# Patient Record
Sex: Male | Born: 1980 | Race: White | Hispanic: No | Marital: Married | Smoking: Current every day smoker
Health system: Southern US, Community
[De-identification: ages and names within clinical notes are randomized; demographics above are authoritative.]

## PROBLEM LIST (undated history)

## (undated) DIAGNOSIS — I10 Essential (primary) hypertension: Secondary | ICD-10-CM

## (undated) HISTORY — PX: MYRINGOTOMY: SUR874

## (undated) HISTORY — PX: ADENOIDECTOMY: SUR15

## (undated) HISTORY — PX: TONSILLECTOMY: SUR1361

---

## 2005-07-13 ENCOUNTER — Ambulatory Visit: Payer: Self-pay | Admitting: Family Medicine

## 2006-04-12 ENCOUNTER — Ambulatory Visit: Payer: Self-pay | Admitting: Family Medicine

## 2006-04-12 DIAGNOSIS — J309 Allergic rhinitis, unspecified: Secondary | ICD-10-CM | POA: Insufficient documentation

## 2006-04-12 DIAGNOSIS — R51 Headache: Secondary | ICD-10-CM | POA: Insufficient documentation

## 2006-04-12 DIAGNOSIS — R519 Headache, unspecified: Secondary | ICD-10-CM | POA: Insufficient documentation

## 2006-04-20 ENCOUNTER — Telehealth: Payer: Self-pay | Admitting: Family Medicine

## 2006-10-08 ENCOUNTER — Ambulatory Visit: Payer: Self-pay | Admitting: Family Medicine

## 2008-02-18 ENCOUNTER — Ambulatory Visit: Payer: Self-pay | Admitting: Family Medicine

## 2008-02-18 DIAGNOSIS — H9209 Otalgia, unspecified ear: Secondary | ICD-10-CM | POA: Insufficient documentation

## 2008-11-17 ENCOUNTER — Ambulatory Visit: Payer: Self-pay | Admitting: Family Medicine

## 2009-09-23 ENCOUNTER — Ambulatory Visit: Payer: Self-pay | Admitting: Family Medicine

## 2009-09-23 DIAGNOSIS — J019 Acute sinusitis, unspecified: Secondary | ICD-10-CM | POA: Insufficient documentation

## 2009-09-23 DIAGNOSIS — G4700 Insomnia, unspecified: Secondary | ICD-10-CM | POA: Insufficient documentation

## 2009-09-24 ENCOUNTER — Encounter: Payer: Self-pay | Admitting: Family Medicine

## 2010-06-07 NOTE — Letter (Signed)
Summary: Depression Questionnaire  Depression Questionnaire   Imported By: Lanelle Bal 09/30/2009 13:18:17  _____________________________________________________________________  External Attachment:    Type:   Image     Comment:   External Document

## 2010-06-07 NOTE — Assessment & Plan Note (Signed)
Summary: sinusitis/ sleep prob   Vital Signs:  Patient profile:   30 year old male Height:      74.25 inches Weight:      208 pounds BMI:     26.62 O2 Sat:      100 % on Room air Temp:     98.5 degrees F oral Pulse rate:   88 / minute BP sitting:   132 / 88  (left arm) Cuff size:   large  Vitals Entered By: Payton Spark CMA (Sep 23, 2009 9:43 AM)  O2 Flow:  Room air CC: ? Sinus infection. C/O head congestion and cough x 2 weeks.    Primary Care Provider:  Nani Gasser MD  CC:  ? Sinus infection. C/O head congestion and cough x 2 weeks. Marland Kitchen  History of Present Illness: 30 yo WM presents for 2 wks of head congestion.  He reports a hx of allergies.  He has been taking Allegra D during this spring which ususally helps but he feels like he is getting worse.  Does not use a nasal spray.  He feels worse at night with sinus pressure in the forehead and cheeks and is unable to blow anything out.  He has some postnasal drip.  No Sore throat.  Mild cough at night.  He has not had a fever or malaise.  He notes trouble sleeping lately which has him feeling tired and some poor focus at work due to fatigue.  This came from stress with in laws.  He and wife have had to drive to [place] for the w/e to help out.      Current Medications (verified): 1)  Allegra-D 24 Hour 180-240 Mg Tb24 (Fexofenadine-Pseudoephedrine) .... Take 1 Tablet By Mouth Once A Day  Allergies (verified): 1)  ! Penicillin G Potassium (Penicillin G Potassium)  Past History:  Past Medical History: Reviewed history from 02/13/2006 and no changes required. _  Past Surgical History: Reviewed history from 02/13/2006 and no changes required. T & A as child, tympanostomy tubes X 4  Social History: Reviewed history from 02/13/2006 and no changes required. 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  Single.  Quit smoking 12/06, no EtOH, no drugs, 3 caffeinated drinks daily, Works out for 1 hr  daily.  Review of Systems      See HPI  Physical Exam  General:  alert, well-developed, well-nourished, and well-hydrated.   Head:  normocephalic and atraumatic.  frontal sinus TTP Eyes:  conjunctiva mildly injected Ears:  scarring both TMs o/w normal Nose:  clear rhinorrhea/ boggy turbinates Mouth:  pharynx pink and moist and fair dentition.   Neck:  no masses.   Lungs:  Normal respiratory effort, chest expands symmetrically. Lungs are clear to auscultation, no crackles or wheezes. Heart:  Normal rate and regular rhythm. S1 and S2 normal without gallop, murmur, click, rub or other extra sounds. Skin:  color normal.   Cervical Nodes:  No lymphadenopathy noted Psych:  good eye contact, not anxious appearing, and not depressed appearing.     Impression & Recommendations:  Problem # 1:  ACUTE SINUSITIS, UNSPECIFIED (ICD-461.9) Treat with Zithromax x 5 days.  Use Allegra D for congestion.  Use Netti Pot 2 x a day and Advil for pain. His updated medication list for this problem includes:    Allegra-d 24 Hour 180-240 Mg Tb24 (Fexofenadine-pseudoephedrine) .Marland Kitchen... Take 1 tablet by mouth once a day    Nasonex 50 Mcg/act Susp (Mometasone furoate) .Marland Kitchen... 2 sprays/ nostril  daily    Zithromax Z-pak 250 Mg Tabs (Azithromycin) .Marland Kitchen... 2 tabs by mouth x 1 day then 1 tab by mouth daily x 4 days  Problem # 2:  ALLERGIC RHINITIS (ICD-477.9) Add Nasonex to his Allegra D for seasonal allergies. His updated medication list for this problem includes:    Nasonex 50 Mcg/act Susp (Mometasone furoate) .Marland Kitchen... 2 sprays/ nostril daily  Problem # 3:  INSOMNIA, TRANSIENT (ICD-780.52) PHQ-9 done to r/o depression.  Score of 7 but pt claims this to be from acute transient stressors and lack of sleep.  He agrees to try RX sleep aid and work on stress reduction.  Will call w/ any changes. His updated medication list for this problem includes:    Zolpidem Tartrate 10 Mg Tabs (Zolpidem tartrate) .Marland Kitchen... 1 tab by mouth at  bedtime as needed sleep  Complete Medication List: 1)  Allegra-d 24 Hour 180-240 Mg Tb24 (Fexofenadine-pseudoephedrine) .... Take 1 tablet by mouth once a day 2)  Nasonex 50 Mcg/act Susp (Mometasone furoate) .... 2 sprays/ nostril daily 3)  Zithromax Z-pak 250 Mg Tabs (Azithromycin) .... 2 tabs by mouth x 1 day then 1 tab by mouth daily x 4 days 4)  Zolpidem Tartrate 10 Mg Tabs (Zolpidem tartrate) .Marland Kitchen.. 1 tab by mouth at bedtime as needed sleep  Patient Instructions: 1)  Take Zithromax x 5 days for sinusitis. 2)  Add Nasonex daily to your allegra D for allergies. 3)  Use Zolpidem only as needed for sleep. Prescriptions: ZOLPIDEM TARTRATE 10 MG TABS (ZOLPIDEM TARTRATE) 1 tab by mouth at bedtime as needed sleep  #10 x 0   Entered and Authorized by:   Seymour Bars DO   Signed by:   Seymour Bars DO on 09/23/2009   Method used:   Printed then faxed to ...       [facility]  [place] [phone][facility] (retail)       [address] [place], [place]  [address]       Ph: [phone] or [phone]       Fax: [phone]   RxID:   [id number] ZITHROMAX Z-PAK 250 MG TABS (AZITHROMYCIN) 2 tabs by mouth x 1 day then 1 tab by mouth daily x 4 days  #1 pack x 0   Entered and Authorized by:   Seymour Bars DO   Signed by:   Seymour Bars DO on 09/23/2009   Method used:   Electronically to        [facility] [phone]* (retail)       [address] [place], [place]  [address]       Ph: [phone] or [phone]       Fax: [phone]   RxID:   [id number] NASONEX 50 MCG/ACT SUSP (MOMETASONE FUROATE) 2 sprays/ nostril daily  #1 bottle x 2   Entered and Authorized by:   Seymour Bars DO   Signed by:   Seymour Bars DO on 09/23/2009   Method used:   [facility]ronically to        [facility] [phone]* (retail)       [address] [place], [place]  [address]       Ph: [phone] or [phone]       Fax: [phone]   RxID:   [id number]

## 2011-03-20 ENCOUNTER — Emergency Department
Admission: EM | Admit: 2011-03-20 | Discharge: 2011-03-20 | Disposition: A | Discharge status: Home / Self Care | Payer: BC Managed Care – PPO | Source: Home / Self Care | Attending: Emergency Medicine | Admitting: Emergency Medicine

## 2011-03-20 ENCOUNTER — Encounter: Payer: Self-pay | Admitting: *Deleted

## 2011-03-20 DIAGNOSIS — J309 Allergic rhinitis, unspecified: Secondary | ICD-10-CM

## 2011-03-20 MED ORDER — AZITHROMYCIN 250 MG PO TABS: ORAL_TABLET | ORAL | Status: AC

## 2011-03-20 NOTE — ED Notes (Signed)
Pt c/o allergies, nasal congestion, yellow nasal d/c, and LT sided sinus burning/preesure/pain x 1 wk. No fever.

## 2011-03-20 NOTE — ED Provider Notes (Signed)
History     CSN: [id number] Arrival date & time: 03/20/2011  9:17 AM   First MD Initiated Contact with Patient 03/20/11 [phone]      Chief Complaint  Patient presents with  . Nasal Congestion  . Allergies    (Consider location/radiation/quality/duration/timing/severity/associated sxs/prior treatment) HPI Benjamin White is a 30 y.o. male who complains of onset of cold symptoms for a few days.  They are using Allegra-D and Flonase which helps his allergies.  His wife is [redacted] weeks pregnant. No sore throat + cough No pleuritic pain No wheezing + nasal congestion + post-nasal drainage +L sided sinus pain/pressure No chest congestion No itchy/red eyes No earache No hemoptysis No SOB No chills/sweats No fever No nausea No vomiting No abdominal pain No diarrhea No skin rashes No fatigue No myalgias No headache    Past Medical History  Diagnosis Date  . Allergic rhinitis     Past Surgical History  Procedure Date  . Tonsillectomy   . Adenoidectomy   . Myringotomy     History reviewed. No pertinent family history.  History  Substance Use Topics  . Smoking status: Current Everyday Smoker -- 0.2 packs/day  . Smokeless tobacco: Not on file  . Alcohol Use: Yes     2 per wk      Review of Systems  Allergies  Penicillins  Home Medications   Current Outpatient Rx  Name Route Sig Dispense Refill  . FEXOFENADINE-PSEUDOEPHEDRINE 180-240 MG PO TB24 Oral Take 1 tablet by mouth daily.      Marland Kitchen FLUTICASONE PROPIONATE 50 MCG/ACT NA SUSP Nasal Place 2 sprays into the nose daily.        BP 119/86  Pulse 90  Temp(Src) 98.3 F (36.8 C) (Oral)  Resp 18  Ht 6\' 3"  (1.905 m)  Wt 209 lb 8 oz (95.029 kg)  BMI 26.19 kg/m2  SpO2 99%  Physical Exam  Nursing note and vitals reviewed. Constitutional: He is oriented to person, place, and time. He appears well-developed and well-nourished.  HENT:  Head: Normocephalic and atraumatic.  Right Ear: Tympanic membrane, external ear  and ear canal normal.  Left Ear: Tympanic membrane, external ear and ear canal normal.  Nose: Mucosal edema and rhinorrhea present. Left sinus exhibits maxillary sinus tenderness.  Mouth/Throat: Posterior oropharyngeal erythema present. No oropharyngeal exudate or posterior oropharyngeal edema.  Neck: Neck supple.  Cardiovascular: Regular rhythm and normal heart sounds.   Pulmonary/Chest: Effort normal and breath sounds normal. No respiratory distress.  Neurological: He is alert and oriented to person, place, and time.  Skin: Skin is warm and dry.  Psychiatric: He has a normal mood and affect. His speech is normal.    ED Course  Procedures (including critical care time)  Labs Reviewed - No data to display No results found.   No diagnosis found.    MDM  Allergic rhinitis vs sinusitis  1)  Take the prescribed antibiotic as instructed.  Hold for a few days, then if worsening can take the Zpak. 2)  Use nasal saline solution (over the counter) at least 3 times a day. 3)  Use over the counter decongestants like Zyrtec-D every 12 hours as needed to help with congestion.  If you have hypertension, do not take medicines with sudafed.  4)  Can take tylenol every 6 hours or motrin every 8 hours for pain or fever. 5)  Follow up with your primary doctor if no improvement in 5-7 days, sooner if increasing pain, fever, or new symptoms.  Must stop smoking ASAP, especially before baby is born.   Lily Kocher, MD 03/20/11 [phone]

## 2012-07-25 ENCOUNTER — Encounter: Payer: Self-pay | Admitting: Sports Medicine

## 2012-07-25 ENCOUNTER — Ambulatory Visit (INDEPENDENT_AMBULATORY_CARE_PROVIDER_SITE_OTHER): Payer: BC Managed Care – PPO | Admitting: Sports Medicine

## 2012-07-25 VITALS — BP 126/84 | HR 96 | Wt 200.0 lb

## 2012-07-25 DIAGNOSIS — R200 Anesthesia of skin: Secondary | ICD-10-CM | POA: Insufficient documentation

## 2012-07-25 DIAGNOSIS — J0100 Acute maxillary sinusitis, unspecified: Secondary | ICD-10-CM | POA: Insufficient documentation

## 2012-07-25 DIAGNOSIS — Z299 Encounter for prophylactic measures, unspecified: Secondary | ICD-10-CM

## 2012-07-25 DIAGNOSIS — Z0000 Encounter for general adult medical examination without abnormal findings: Secondary | ICD-10-CM | POA: Insufficient documentation

## 2012-07-25 DIAGNOSIS — Z87891 Personal history of nicotine dependence: Secondary | ICD-10-CM

## 2012-07-25 DIAGNOSIS — F17210 Nicotine dependence, cigarettes, uncomplicated: Secondary | ICD-10-CM | POA: Insufficient documentation

## 2012-07-25 DIAGNOSIS — R209 Unspecified disturbances of skin sensation: Secondary | ICD-10-CM

## 2012-07-25 MED ORDER — AZITHROMYCIN 250 MG PO TABS: ORAL_TABLET | ORAL | Status: DC

## 2012-07-25 MED ORDER — FLUTICASONE PROPIONATE 50 MCG/ACT NA SUSP: NASAL | Status: DC

## 2012-07-25 NOTE — Assessment & Plan Note (Signed)
Checking some routine blood work. 

## 2012-07-25 NOTE — Assessment & Plan Note (Signed)
Allergic to penicillin, azithromycin, Flonase. I hope that this will resolve his lip numbness.

## 2012-07-25 NOTE — Assessment & Plan Note (Signed)
Symptoms are not classic for TIA. Exam is benign and I cannot pick up the numbness. We are going to keep this in the back of our hands and treated for a sinus infection. He will return in a week to reevaluate this, if still no better we can consider TIA workup.

## 2012-07-25 NOTE — Assessment & Plan Note (Signed)
Currently quit. Congratulated.

## 2012-07-25 NOTE — Progress Notes (Signed)
Subjective:    CC: Establish care.   HPI:  Lip numbness: This pleasant 32 year old male is having a great deal of stress in his life comes in with 4 days of numbness in his left upper lip only on the left half. He denies any subsequent headaches, palpitations, chest pain, numbness or tingling in any other part of his body, or weakness. He does have some sniffles, nasal drainage and left-sided maxillary sinus pressure. He is a smoker but recently quit, and is worried about heart attack, stroke, and oral cancer. Symptoms are intermittent.  Improving.  Smoker: Quit recently.  Preventive measure: Has not had any blood work done.  Past medical history, Surgical history, Family history not pertinant except as noted below, Social history, Allergies, and medications have been entered into the medical record, reviewed, and no changes needed.   Review of Systems: No headache, visual changes, nausea, vomiting, diarrhea, constipation, dizziness, abdominal pain, skin rash, fevers, chills, night sweats, swollen lymph nodes, weight loss, chest pain, body aches, joint swelling, muscle aches, shortness of breath, mood changes, visual or auditory hallucinations.  Objective:    General: Well Developed, well nourished, and in no acute distress.  Neuro: Alert and oriented x3, extra-ocular muscles intact, sensation grossly intact. Cranial nerves II through XII intact, motor, sensory, coordinative functions are intact. HEENT: Normocephalic, atraumatic, pupils equal round reactive to light, neck supple, no masses, no lymphadenopathy, thyroid nonpalpable. Entire oropharynx examined, no lesions seen, bilateral tympanic membrane sclerosis, mild tenderness over maxillary sinuses. Skin: Warm and dry, no rashes noted.  Cardiac: Regular rate and rhythm, no murmurs rubs or gallops.  Respiratory: Clear to auscultation bilaterally. Not using accessory muscles, speaking in full sentences.  Abdominal: Soft, nontender,  nondistended, positive bowel sounds, no masses, no organomegaly.  Musculoskeletal: Shoulder, elbow, wrist, hip, knee, ankle stable, and with full range of motion. Impression and Recommendations:   The patient was counselled, risk factors were discussed, anticipatory guidance given.

## 2012-07-30 ENCOUNTER — Ambulatory Visit: Payer: BC Managed Care – PPO | Admitting: Sports Medicine

## 2012-12-04 LAB — COMPREHENSIVE METABOLIC PANEL
Albumin: 5.1 g/dL (ref 3.5–5.2)
Alkaline Phosphatase: 65 U/L (ref 39–117)
BUN: 12 mg/dL (ref 6–23)
Calcium: 10.2 mg/dL (ref 8.4–10.5)
Chloride: 100 mEq/L (ref 96–112)
Creat: 1 mg/dL (ref 0.50–1.35)
Glucose, Bld: 87 mg/dL (ref 70–99)
Potassium: 4.9 mEq/L (ref 3.5–5.3)

## 2012-12-04 LAB — LIPID PANEL
Cholesterol: 171 mg/dL (ref 0–200)
HDL: 52 mg/dL (ref >39)
LDL Cholesterol: 96 mg/dL (ref 0–99)
Total CHOL/HDL Ratio: 3.3 Ratio
Triglycerides: 115 mg/dL (ref <150)
VLDL: 23 mg/dL (ref 0–40)

## 2012-12-04 LAB — COMPREHENSIVE METABOLIC PANEL WITH GFR
ALT: 15 U/L (ref 0–53)
AST: 15 U/L (ref 0–37)
CO2: 27 meq/L (ref 19–32)
Sodium: 138 meq/L (ref 135–145)
Total Bilirubin: 1.3 mg/dL — ABNORMAL HIGH (ref 0.3–1.2)
Total Protein: 7.6 g/dL (ref 6.0–8.3)

## 2012-12-04 LAB — CBC
HCT: 45.6 % (ref 39.0–52.0)
Hemoglobin: 16.2 g/dL (ref 13.0–17.0)
MCH: 32.2 pg (ref 26.0–34.0)
MCHC: 35.5 g/dL (ref 30.0–36.0)
MCV: 90.7 fL (ref 78.0–100.0)
Platelets: 332 K/uL (ref 150–400)
RBC: 5.03 MIL/uL (ref 4.22–5.81)
RDW: 13.8 % (ref 11.5–15.5)
WBC: 8.9 K/uL (ref 4.0–10.5)

## 2012-12-04 LAB — TSH: TSH: 1.381 u[IU]/mL (ref 0.350–4.500)

## 2012-12-05 LAB — VITAMIN D 25 HYDROXY (VIT D DEFICIENCY, FRACTURES): Vit D, 25-Hydroxy: 64 ng/mL (ref 30–89)

## 2012-12-06 ENCOUNTER — Ambulatory Visit (INDEPENDENT_AMBULATORY_CARE_PROVIDER_SITE_OTHER): Payer: BC Managed Care – PPO | Admitting: Sports Medicine

## 2012-12-06 ENCOUNTER — Encounter: Payer: Self-pay | Admitting: Sports Medicine

## 2012-12-06 VITALS — BP 136/84 | HR 74 | Wt 189.0 lb

## 2012-12-06 DIAGNOSIS — R209 Unspecified disturbances of skin sensation: Secondary | ICD-10-CM

## 2012-12-06 DIAGNOSIS — R200 Anesthesia of skin: Secondary | ICD-10-CM

## 2012-12-06 NOTE — Assessment & Plan Note (Addendum)
This is persistent and seems to be associated with emotional stressors. There is no preceding or subsequent headache, or any other symptoms to suggest multiple sclerosis, or complex migraines. No associated cardiovascular symptoms. He does describe numbness in the left trigeminal distribution. At this point I am going to obtain carotid Dopplers, as well as an MRI of the brain with special focus on the pons. I like to see him back after the MRI.  MRI did show a lesion in the left pons which could explain left lip numbness, at this point I am going to refer him to a neurologist.
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
## 2012-12-09 ENCOUNTER — Telehealth: Payer: Self-pay

## 2012-12-09 NOTE — Addendum Note (Signed)
Addended by: Monica Becton on: 12/09/2012 01:20 PM   Modules accepted: Orders

## 2012-12-09 NOTE — Telephone Encounter (Signed)
PA has been submitted to [facility] for MRI brain and MRI face. It has not been approved as of now. Papers are in the PA imaging folder. Vernetta Dizdarevic,CMA

## 2012-12-11 ENCOUNTER — Telehealth: Payer: Self-pay | Admitting: Sports Medicine

## 2012-12-11 ENCOUNTER — Other Ambulatory Visit: Payer: Self-pay | Admitting: Sports Medicine

## 2012-12-11 DIAGNOSIS — Z1389 Encounter for screening for other disorder: Secondary | ICD-10-CM

## 2012-12-11 NOTE — Telephone Encounter (Signed)
Discussed with physician reviewer, MRI of the brain and MRI face ordered, approval #[id number] good for 30 days, and then for both exams.

## 2012-12-14 ENCOUNTER — Ambulatory Visit (HOSPITAL_BASED_OUTPATIENT_CLINIC_OR_DEPARTMENT_OTHER)
Admission: RE | Admit: 2012-12-14 | Discharge: 2012-12-14 | Disposition: A | Discharge status: Home / Self Care | Payer: BC Managed Care – PPO | Source: Ambulatory Visit | Attending: Sports Medicine | Admitting: Sports Medicine

## 2012-12-14 DIAGNOSIS — Z1389 Encounter for screening for other disorder: Secondary | ICD-10-CM

## 2012-12-14 DIAGNOSIS — R209 Unspecified disturbances of skin sensation: Secondary | ICD-10-CM | POA: Insufficient documentation

## 2012-12-14 DIAGNOSIS — R200 Anesthesia of skin: Secondary | ICD-10-CM

## 2012-12-14 MED ORDER — GADOBENATE DIMEGLUMINE 529 MG/ML IV SOLN
20.0000 mL | Freq: Once | INTRAVENOUS | Status: AC | PRN
  Administered 2012-12-14: 20 mL via INTRAVENOUS

## 2012-12-16 NOTE — Addendum Note (Signed)
Addended by: Monica Becton on: 12/16/2012 09:12 AM   Modules accepted: Orders

## 2012-12-17 ENCOUNTER — Other Ambulatory Visit: Payer: BC Managed Care – PPO

## 2012-12-19 ENCOUNTER — Ambulatory Visit (HOSPITAL_BASED_OUTPATIENT_CLINIC_OR_DEPARTMENT_OTHER)
Admission: RE | Admit: 2012-12-19 | Discharge: 2012-12-19 | Disposition: A | Discharge status: Home / Self Care | Payer: BC Managed Care – PPO | Source: Ambulatory Visit | Attending: Sports Medicine | Admitting: Sports Medicine

## 2012-12-19 ENCOUNTER — Other Ambulatory Visit: Payer: Self-pay | Admitting: Sports Medicine

## 2012-12-19 DIAGNOSIS — R209 Unspecified disturbances of skin sensation: Secondary | ICD-10-CM | POA: Insufficient documentation

## 2012-12-19 DIAGNOSIS — R200 Anesthesia of skin: Secondary | ICD-10-CM

## 2012-12-20 ENCOUNTER — Ambulatory Visit (INDEPENDENT_AMBULATORY_CARE_PROVIDER_SITE_OTHER): Payer: BC Managed Care – PPO | Admitting: Neurology

## 2012-12-20 ENCOUNTER — Encounter: Payer: Self-pay | Admitting: Neurology

## 2012-12-20 VITALS — BP 135/85 | HR 87 | Ht 74.0 in | Wt 191.0 lb

## 2012-12-20 DIAGNOSIS — R209 Unspecified disturbances of skin sensation: Secondary | ICD-10-CM

## 2012-12-20 DIAGNOSIS — R200 Anesthesia of skin: Secondary | ICD-10-CM

## 2012-12-20 NOTE — Progress Notes (Signed)
Reason for visit: Numbness  Benjamin White is a 32 y.o. male  History of present illness:  Benjamin White is a 32 year old right-handed white male with a history of intermittent numbness and tingling sensations involving the left side of the lips, and the left lower face that began in March of 2014. The patient indicates that he had some problems for 2 or 3 days, and the symptoms resolved until mid July 2014. The patient began having intermittent episodes of tingling and numbness that would come and go. The patient indicates that when he thought about it, the numbness will come on, and when he was active doing other things, the numbness would disappear. The patient denies any overt headache, vision changes, speech changes, swallowing problems, or neck pain. The patient denies any numbness or weakness of the arms or legs. The patient denies any weakness of the extremities, difficulty with balance, or problems controlling the bowels or the bladder. The patient had MRI evaluation of the brain that shows evidence of a questionable lesion in the left pons, but this is seen only on one slice, on the flair sequence. The possibility of an artifact was entertained. The study was done with and without gadolinium enhancement. The patient is sent to this office for an evaluation.  Past Medical History  Diagnosis Date  . Allergic rhinitis     Past Surgical History  Procedure Laterality Date  . Tonsillectomy    . Adenoidectomy    . Myringotomy      Family History  Problem Relation Age of Onset  . Hypertension Father     Social history:  reports that he has been smoking.  He quit smokeless tobacco use about 19 months ago. He reports that  drinks alcohol. He reports that he does not use illicit drugs.  Medications:  No current outpatient prescriptions on file prior to visit.   No current facility-administered medications on file prior to visit.    Allergies:  Allergies  Allergen Reactions  .  Penicillins Hives and Rash    ROS:  Out of a complete 14 system review of symptoms, the patient complains only of the following symptoms, and all other reviewed systems are negative.  Numbness Anxiety, and difficulty with sleep, racing thoughts Snoring  Blood pressure 135/85, pulse 87, height 6\' 2"  (1.88 m), weight 191 lb (86.637 kg).  Physical Exam  General: The patient is alert and cooperative at the time of the examination.  Head: Pupils are equal, round, and reactive to light. Discs are flat bilaterally.  Neck: The neck is supple, no carotid bruits are noted.  Respiratory: The respiratory examination is clear.  Cardiovascular: The cardiovascular examination reveals a regular rate and rhythm, no obvious murmurs or rubs are noted.  Skin: Extremities are without significant edema.  Neurologic Exam  Mental status:  Cranial nerves: Facial symmetry is present. There is good sensation of the face to pinprick and soft touch bilaterally. The strength of the facial muscles and the muscles to head turning and shoulder shrug are normal bilaterally. Speech is well enunciated, no aphasia or dysarthria is noted. Extraocular movements are full. Visual fields are full.  Motor: The motor testing reveals 5 over 5 strength of all 4 extremities. Good symmetric motor tone is noted throughout.  Sensory: Sensory testing is intact to pinprick, soft touch, vibration sensation, and position sense on all 4 extremities. No evidence of extinction is noted.  Coordination: Cerebellar testing reveals good finger-nose-finger and heel-to-shin bilaterally.  Gait and station:  Gait is normal. Tandem gait is normal. Romberg is negative. No drift is seen.  Reflexes: Deep tendon reflexes are symmetric and normal bilaterally. Toes are downgoing on the right, equivocally upgoing toe on the left.   Assessment/Plan:  1. Subjective numbness, left face  The patient indicates that the episodes of numbness and  tingling come and go, and seemed to generate some degree of anxiety. The patient has had a relatively normal MRI study, and the lesions seen in the left pons may be an artifact. The patient will undergo some blood work today, and the patient will be set up for a repeat MRI the brain in 6 months. The patient will contact me if he believes that new symptoms are evolving.  Marlan Palau MD 12/22/2012 2:09 PM  [facility] [address] [place], [place] [address]  Phone [phone] Fax [phone]

## 2012-12-24 ENCOUNTER — Encounter: Payer: Self-pay | Admitting: *Deleted

## 2012-12-25 ENCOUNTER — Other Ambulatory Visit: Payer: Self-pay | Admitting: Neurology

## 2012-12-27 ENCOUNTER — Telehealth: Payer: Self-pay | Admitting: Neurology

## 2012-12-30 ENCOUNTER — Telehealth: Payer: Self-pay | Admitting: Neurology

## 2012-12-30 NOTE — Telephone Encounter (Signed)
I called patient. The blood work that was done was unremarkable. We will recheck the MRI the brain in about 6 months.

## 2012-12-31 NOTE — Telephone Encounter (Signed)
Lab results faxed over from [facility] and given to doctor.

## 2013-01-20 ENCOUNTER — Encounter: Payer: Self-pay | Admitting: Sports Medicine

## 2013-01-21 MED ORDER — AMITRIPTYLINE HCL 50 MG PO TABS: ORAL_TABLET | ORAL | Status: DC

## 2013-02-26 LAB — ANA W/REFLEX: Anti Nuclear Antibody(ANA): NEGATIVE

## 2013-02-26 LAB — VITAMIN B12: Vitamin B-12: 438 pg/mL (ref 211–946)

## 2013-02-26 LAB — RHEUMATOID FACTOR: Rhuematoid fact SerPl-aCnc: 9.1 IU/mL (ref 0.0–13.9)

## 2013-02-27 ENCOUNTER — Telehealth: Payer: Self-pay | Admitting: Neurology

## 2013-02-27 NOTE — Telephone Encounter (Signed)
I called patient, left a message. I will call back later. 

## 2013-02-27 NOTE — Telephone Encounter (Signed)
Spoke to patient and he stated he is still having numbness on the left side of his face it happens about once a week.  He went to his referring doctor who prescribed an anti anxiety medication but he has not taken it.  [phone]

## 2013-02-27 NOTE — Progress Notes (Signed)
Quick Note:  Left message that the blood work results were unremarkable, per Dr. Anne Hahn. ______

## 2013-03-01 NOTE — Telephone Encounter (Signed)
I called the patient again, I left a message again. If the patient needs to talk with me, he is to call back, leaving number that he can be reached at.

## 2013-03-13 ENCOUNTER — Other Ambulatory Visit: Payer: Self-pay

## 2013-05-06 ENCOUNTER — Encounter: Payer: Self-pay | Admitting: Family Medicine

## 2013-05-06 ENCOUNTER — Ambulatory Visit (INDEPENDENT_AMBULATORY_CARE_PROVIDER_SITE_OTHER): Payer: BC Managed Care – PPO | Admitting: Family Medicine

## 2013-05-06 DIAGNOSIS — J309 Allergic rhinitis, unspecified: Secondary | ICD-10-CM

## 2013-05-06 DIAGNOSIS — H6690 Otitis media, unspecified, unspecified ear: Secondary | ICD-10-CM

## 2013-05-06 MED ORDER — CEFDINIR 300 MG PO CAPS: 300.0000 mg | ORAL_CAPSULE | Freq: Two times a day (BID) | ORAL | Status: AC

## 2013-05-06 MED ORDER — FLUTICASONE PROPIONATE 50 MCG/ACT NA SUSP: NASAL | Status: DC

## 2013-05-06 NOTE — Progress Notes (Signed)
CC: Benjamin White is a 32 y.o. male is here for right ear pain   Subjective: HPI:  Complains of bilateral ear pain described as fullness that is nonradiating right greater than left that has been present for the last 3 days worsening on a daily basis. Present all hours of the day nothing particularly makes better or worse. Accompanied by mild nasal congestion with clear nasal discharge. Reports decreased hearing in right ear. Denies recent trauma head injury. Denies fevers, chills, dizziness, motor sensory disturbances other than described above   Review Of Systems Outlined In HPI  Past Medical History  Diagnosis Date  . Allergic rhinitis      Family History  Problem Relation Age of Onset  . Hypertension Father      History  Substance Use Topics  . Smoking status: Current Every Day Smoker -- 0.25 packs/day  . Smokeless tobacco: Former Neurosurgeon    Quit date: 05/09/2011  . Alcohol Use: Yes     Comment: up to 4 drinks daily     Objective: Filed Vitals:   05/06/13 0934  BP: 135/79  Pulse: 84  Temp: 97.8 F (36.6 C)    General: Alert and Oriented, No Acute Distress HEENT: Pupils equal, round, reactive to light. Conjunctivae clear.  External ears unremarkable, canals clear with intact TMs bilateral middle ear purulence effusion with loss of landmarks on both tympanic membranes.. Pink inferior turbinates.  Moist mucous membranes, pharynx without inflammation nor lesions.  Neck supple without palpable lymphadenopathy nor abnormal masses. Neuro: Rinne and Weber normal Extremities: No peripheral edema.  Strong peripheral pulses.  Mental Status: No depression, anxiety, nor agitation. Skin: Warm and dry.  Assessment & Plan: Benjamin White was seen today for right ear pain.  Diagnoses and associated orders for this visit:  Otitis media, bilateral - cefdinir (OMNICEF) 300 MG capsule; Take 1 capsule (300 mg total) by mouth 2 (two) times daily. - fluticasone (FLONASE) 50 MCG/ACT  nasal spray; One spray each nostril twice a day.  Allergic sinusitis    Bilateral otitis media: Start Omnicef due to penicillin allergy reported as rash no history of life threatening reaction. Patient requests refill for fluticasone for seasonal allergies.  Return if symptoms worsen or fail to improve.

## 2013-05-20 ENCOUNTER — Encounter: Payer: Self-pay | Admitting: Family Medicine

## 2013-06-24 ENCOUNTER — Telehealth: Payer: Self-pay | Admitting: Neurology

## 2013-06-24 DIAGNOSIS — R9089 Other abnormal findings on diagnostic imaging of central nervous system: Secondary | ICD-10-CM

## 2013-06-24 DIAGNOSIS — R209 Unspecified disturbances of skin sensation: Secondary | ICD-10-CM

## 2013-06-24 NOTE — Telephone Encounter (Signed)
Message copied by Stephanie AcreWILLIS, CHARLES on Tue Jun 24, 2013 10:15 AM ------      Message from: Stephanie AcreWILLIS, CHARLES      Created: Fri Dec 20, 2012  2:00 PM       Repeat MRI the brain with and without gadolinium. Questionable left pontine lesion. ------

## 2013-06-24 NOTE — Telephone Encounter (Signed)
I called patient. The patient has had some left facial numbness off and on over the last 6 months. At times, he may feel normal. We will repeat the MRI the brain to look for any changes in the possible pontine lesion seen previously.

## 2013-06-27 ENCOUNTER — Other Ambulatory Visit: Payer: Self-pay | Admitting: Neurology

## 2013-06-27 DIAGNOSIS — R209 Unspecified disturbances of skin sensation: Secondary | ICD-10-CM

## 2013-07-01 ENCOUNTER — Ambulatory Visit (INDEPENDENT_AMBULATORY_CARE_PROVIDER_SITE_OTHER): Payer: BC Managed Care – PPO

## 2013-07-01 DIAGNOSIS — R209 Unspecified disturbances of skin sensation: Secondary | ICD-10-CM

## 2013-07-01 DIAGNOSIS — Z7729 Contact with and (suspected ) exposure to other hazardous substances: Secondary | ICD-10-CM

## 2013-07-02 ENCOUNTER — Other Ambulatory Visit: Payer: BC Managed Care – PPO

## 2013-07-03 ENCOUNTER — Other Ambulatory Visit: Payer: BC Managed Care – PPO

## 2013-07-16 ENCOUNTER — Ambulatory Visit (INDEPENDENT_AMBULATORY_CARE_PROVIDER_SITE_OTHER): Payer: BC Managed Care – PPO

## 2013-07-16 DIAGNOSIS — R209 Unspecified disturbances of skin sensation: Secondary | ICD-10-CM

## 2013-07-16 DIAGNOSIS — R930 Abnormal findings on diagnostic imaging of skull and head, not elsewhere classified: Secondary | ICD-10-CM

## 2013-07-16 DIAGNOSIS — R9089 Other abnormal findings on diagnostic imaging of central nervous system: Secondary | ICD-10-CM

## 2013-07-17 ENCOUNTER — Telehealth: Payer: Self-pay | Admitting: Neurology

## 2013-07-17 MED ORDER — GADOPENTETATE DIMEGLUMINE 469.01 MG/ML IV SOLN: 19.0000 mL | Freq: Once | INTRAVENOUS | Status: AC | PRN

## 2013-07-17 NOTE — Telephone Encounter (Signed)
I called patient. The MRI the brain was relatively unremarkable. I think explain the left facial numbness. The patient no longer has an identifiable lesion in the pons as was seen previously. This may have been an artifact.   MRI brain 07/16/2013:  Impression   Normal MRI brain (with and without). Incidentally noted is non-specific  fluid/inflammation in the mastoid air cells.

## 2013-07-31 ENCOUNTER — Ambulatory Visit (INDEPENDENT_AMBULATORY_CARE_PROVIDER_SITE_OTHER): Payer: BC Managed Care – PPO | Admitting: Sports Medicine

## 2013-07-31 ENCOUNTER — Encounter: Payer: Self-pay | Admitting: Sports Medicine

## 2013-07-31 VITALS — BP 138/87 | HR 79 | Temp 98.4°F | Ht 74.0 in | Wt 199.0 lb

## 2013-07-31 DIAGNOSIS — R209 Unspecified disturbances of skin sensation: Secondary | ICD-10-CM

## 2013-07-31 DIAGNOSIS — R200 Anesthesia of skin: Secondary | ICD-10-CM

## 2013-07-31 DIAGNOSIS — J0100 Acute maxillary sinusitis, unspecified: Secondary | ICD-10-CM

## 2013-07-31 DIAGNOSIS — H6990 Unspecified Eustachian tube disorder, unspecified ear: Secondary | ICD-10-CM | POA: Insufficient documentation

## 2013-07-31 DIAGNOSIS — Z23 Encounter for immunization: Secondary | ICD-10-CM

## 2013-07-31 DIAGNOSIS — H6980 Other specified disorders of Eustachian tube, unspecified ear: Secondary | ICD-10-CM | POA: Insufficient documentation

## 2013-07-31 MED ORDER — BECLOMETHASONE DIPROP MONOHYD 42 MCG/SPRAY NA SUSP: 2.0000 | Freq: Two times a day (BID) | NASAL | Status: DC

## 2013-07-31 MED ORDER — CLARITHROMYCIN 500 MG PO TABS: 500.0000 mg | ORAL_TABLET | Freq: Two times a day (BID) | ORAL | Status: DC

## 2013-07-31 NOTE — Progress Notes (Signed)
  Subjective:    CC: Sinus infection  HPI: For the past several days Benjamin White has had a burning sensation over his left maxillary sinus radiating to the upper teeth and his left ear with nasal discharge. No cough, no constitutional symptoms, no GI symptoms, moderate, persistent.  Lip numbness: Left-sided and persistent, it is associated with emotional stressors. His initial MRI of the brain did show a possible lesion which may have represented an artifact in the left side of the pons, which could explain his symptoms. I send him to neurology, neurology repeated an MRI which was negative for this previous lesion. He had no other symptoms to suggest multiple sclerosis, and no preceding or following headache to suggest complex migraines. We did prescribe amitriptyline at a previous visit which she has not yet started taking.  Past medical history, Surgical history, Family history not pertinant except as noted below, Social history, Allergies, and medications have been entered into the medical record, reviewed, and no changes needed.   Review of Systems: No fevers, chills, night sweats, weight loss, chest pain, or shortness of breath.   Objective:    General: Well Developed, well nourished, and in no acute distress.  Neuro: Alert and oriented x3, extra-ocular muscles intact, sensation grossly intact.  HEENT: Normocephalic, atraumatic, pupils equal round reactive to light, neck supple, no masses, no lymphadenopathy, thyroid nonpalpable. Oropharynx, nasopharynx, external ear canals are unremarkable with the exception of some sclerosis over both eardrums. Tender to palpation over the maxillary sinus on the left side. Skin: Warm and dry, no rashes. Cardiac: Regular rate and rhythm, no murmurs rubs or gallops, no lower extremity edema.  Respiratory: Clear to auscultation bilaterally. Not using accessory muscles, speaking in full sentences.  Impression and Recommendations:

## 2013-07-31 NOTE — Assessment & Plan Note (Signed)
Benjamin White continues to have occasional lip numbness, predominantly on the left side, his initial MRI did show a possible hypodensity in the pons that could explain his symptoms. His neurologist repeated the MRI, and this lesion had resolved, he does tell me he was having symptoms during the MRI. He also tells me of the symptoms occur when he is stressed out. I did add amitriptyline which he has not yet started taking.

## 2013-07-31 NOTE — Assessment & Plan Note (Signed)
Biaxin, Beconase. Return as needed.

## 2013-08-05 ENCOUNTER — Other Ambulatory Visit: Payer: Self-pay | Admitting: Family Medicine

## 2013-09-15 ENCOUNTER — Encounter: Payer: Self-pay | Admitting: Sports Medicine

## 2013-09-15 ENCOUNTER — Ambulatory Visit (INDEPENDENT_AMBULATORY_CARE_PROVIDER_SITE_OTHER): Payer: BC Managed Care – PPO | Admitting: Sports Medicine

## 2013-09-15 VITALS — BP 147/88 | HR 108 | Ht 74.0 in | Wt 202.0 lb

## 2013-09-15 DIAGNOSIS — R200 Anesthesia of skin: Secondary | ICD-10-CM

## 2013-09-15 DIAGNOSIS — H6980 Other specified disorders of Eustachian tube, unspecified ear: Secondary | ICD-10-CM

## 2013-09-15 DIAGNOSIS — R209 Unspecified disturbances of skin sensation: Secondary | ICD-10-CM

## 2013-09-15 DIAGNOSIS — H6990 Unspecified Eustachian tube disorder, unspecified ear: Secondary | ICD-10-CM

## 2013-09-15 MED ORDER — PHENYLEPHRINE HCL 10 MG PO TABS: 10.0000 mg | ORAL_TABLET | ORAL | Status: DC | PRN

## 2013-09-15 NOTE — Assessment & Plan Note (Signed)
Essentially resolved with amitriptyline.

## 2013-09-15 NOTE — Progress Notes (Signed)
  Subjective:    CC: Followup  HPI: Left lip numbness: Initially we did note a small hypodensity in the left pons which could explain left facial numbness, neurology repeated the MRI which showed resolution of his hypodensity. Subsequently I started amitriptyline, and now his numbness is essentially gone. He does me tell it did occur during times of stress.  Ear fullness: Saw him a couple of months ago, we treated him with a nasal steroid and antibiotics, symptoms resolved. He continues to use nasal steroids but unfortunately has persistent sensation of fullness behind the right eardrum. He has seen ENT in the distant past who recommended steroids versus myringotomy tube placement.  Past medical history, Surgical history, Family history not pertinant except as noted below, Social history, Allergies, and medications have been entered into the medical record, reviewed, and no changes needed.   Review of Systems: No fevers, chills, night sweats, weight loss, chest pain, or shortness of breath.   Objective:    General: Well Developed, well nourished, and in no acute distress.  Neuro: Alert and oriented x3, extra-ocular muscles intact, sensation grossly intact.  HEENT: Normocephalic, atraumatic, pupils equal round reactive to light, neck supple, no masses, no lymphadenopathy, thyroid nonpalpable. Oropharynx and nasopharynx are unremarkable, there is tympanosclerosis bilaterally with some mild serous otitis media bilaterally. Skin: Warm and dry, no rashes. Cardiac: Regular rate and rhythm, no murmurs rubs or gallops, no lower extremity edema.  Respiratory: Clear to auscultation bilaterally. Not using accessory muscles, speaking in full sentences.  Impression and Recommendations:

## 2013-09-15 NOTE — Patient Instructions (Signed)
Serous Otitis Media  Serous otitis media is fluid in the middle ear space. This space contains the bones for hearing and air. Air in the middle ear space helps to transmit sound.  The air gets there through the eustachian tube. This tube goes from the back of the nose (nasopharynx) to the middle ear space. It keeps the pressure in the middle ear the same as the outside world. It also helps to drain fluid from the middle ear space. CAUSES  Serous otitis media occurs when the eustachian tube gets blocked. Blockage can come from:  Ear infections.  Colds and other upper respiratory infections.  Allergies.  Irritants such as cigarette smoke.  Sudden changes in air pressure (such as descending in an airplane).  Enlarged adenoids.  A mass in the nasopharynx. During colds and upper respiratory infections, the middle ear space can become temporarily filled with fluid. This can happen after an ear infection also. Once the infection clears, the fluid will generally drain out of the ear through the eustachian tube. If it does not, then serous otitis media occurs. SIGNS AND SYMPTOMS   Hearing loss.  A feeling of fullness in the ear, without pain.  Young children may not show any symptoms but may show slight behavioral changes, such as agitation, ear pulling, or crying. DIAGNOSIS  Serous otitis media is diagnosed by an ear exam. Tests may be done to check on the movement of the eardrum. Hearing exams may also be done. TREATMENT  The fluid most often goes away without treatment. If allergy is the cause, allergy treatment may be helpful. Fluid that persists for several months may require minor surgery. A small tube is placed in the eardrum to:  Drain the fluid.  Restore the air in the middle ear space. In certain situations, antibiotics are used to avoid surgery. Surgery may be done to remove enlarged adenoids (if this is the cause). HOME CARE INSTRUCTIONS   Keep children away from tobacco  smoke.  Be sure to keep any follow-up appointments. SEEK MEDICAL CARE IF:   Your hearing is not better in 3 months.  Your hearing is worse.  You have ear pain.  You have drainage from the ear.  You have dizziness.  You have serous otitis media only in one ear or have any bleeding from your nose (epistaxis).  You notice a lump on your neck. MAKE SURE YOU:  Understand these instructions.   Will watch your condition.   Will get help right away if you are not doing well or get worse.  Document Released: 07/15/2003 Document Revised: 12/25/2012 Document Reviewed: 11/19/2012 Exit[employer] Patient Information 2014 [employer].  

## 2013-09-15 NOTE — Assessment & Plan Note (Signed)
Continue nasal steroids, adding decongestants. Return to see me in a month, referral to ENT for consideration of myringotomy tube placement if no better.

## 2013-09-22 ENCOUNTER — Telehealth: Payer: Self-pay

## 2013-09-22 MED ORDER — AZITHROMYCIN 250 MG PO TABS: ORAL_TABLET | ORAL | Status: DC

## 2013-09-22 NOTE — Telephone Encounter (Signed)
Patient advised.

## 2013-09-22 NOTE — Telephone Encounter (Signed)
Sounds like a sinus infection, I am going to call him in some azithromycin and he should continue Flonase.

## 2013-09-22 NOTE — Telephone Encounter (Signed)
Benjamin White is unable to come in to [place] for a few days. He has a sore throat, runny nose with yellow drainage and pressure in his face for a few days. Denies fever, chills or sweats. He has tried Sudafed and a [employer] Pot with some relief. Should he go see a doctor in [place] or give it a couple of days.

## 2015-04-25 IMAGING — CR DG ORBITS COMPLETE 4+V
2 series · 2 of 2 positions shown · non-contrast
Comparison: 12/14/2012.

CLINICAL DATA: 32-year-old male with history of metal exposure to
the orbits. Planned MRI. Initial encounter.

EXAM:
ORBITS FOR FOREIGN BODY - 2 VIEW

[view not recorded (1 of 2)]
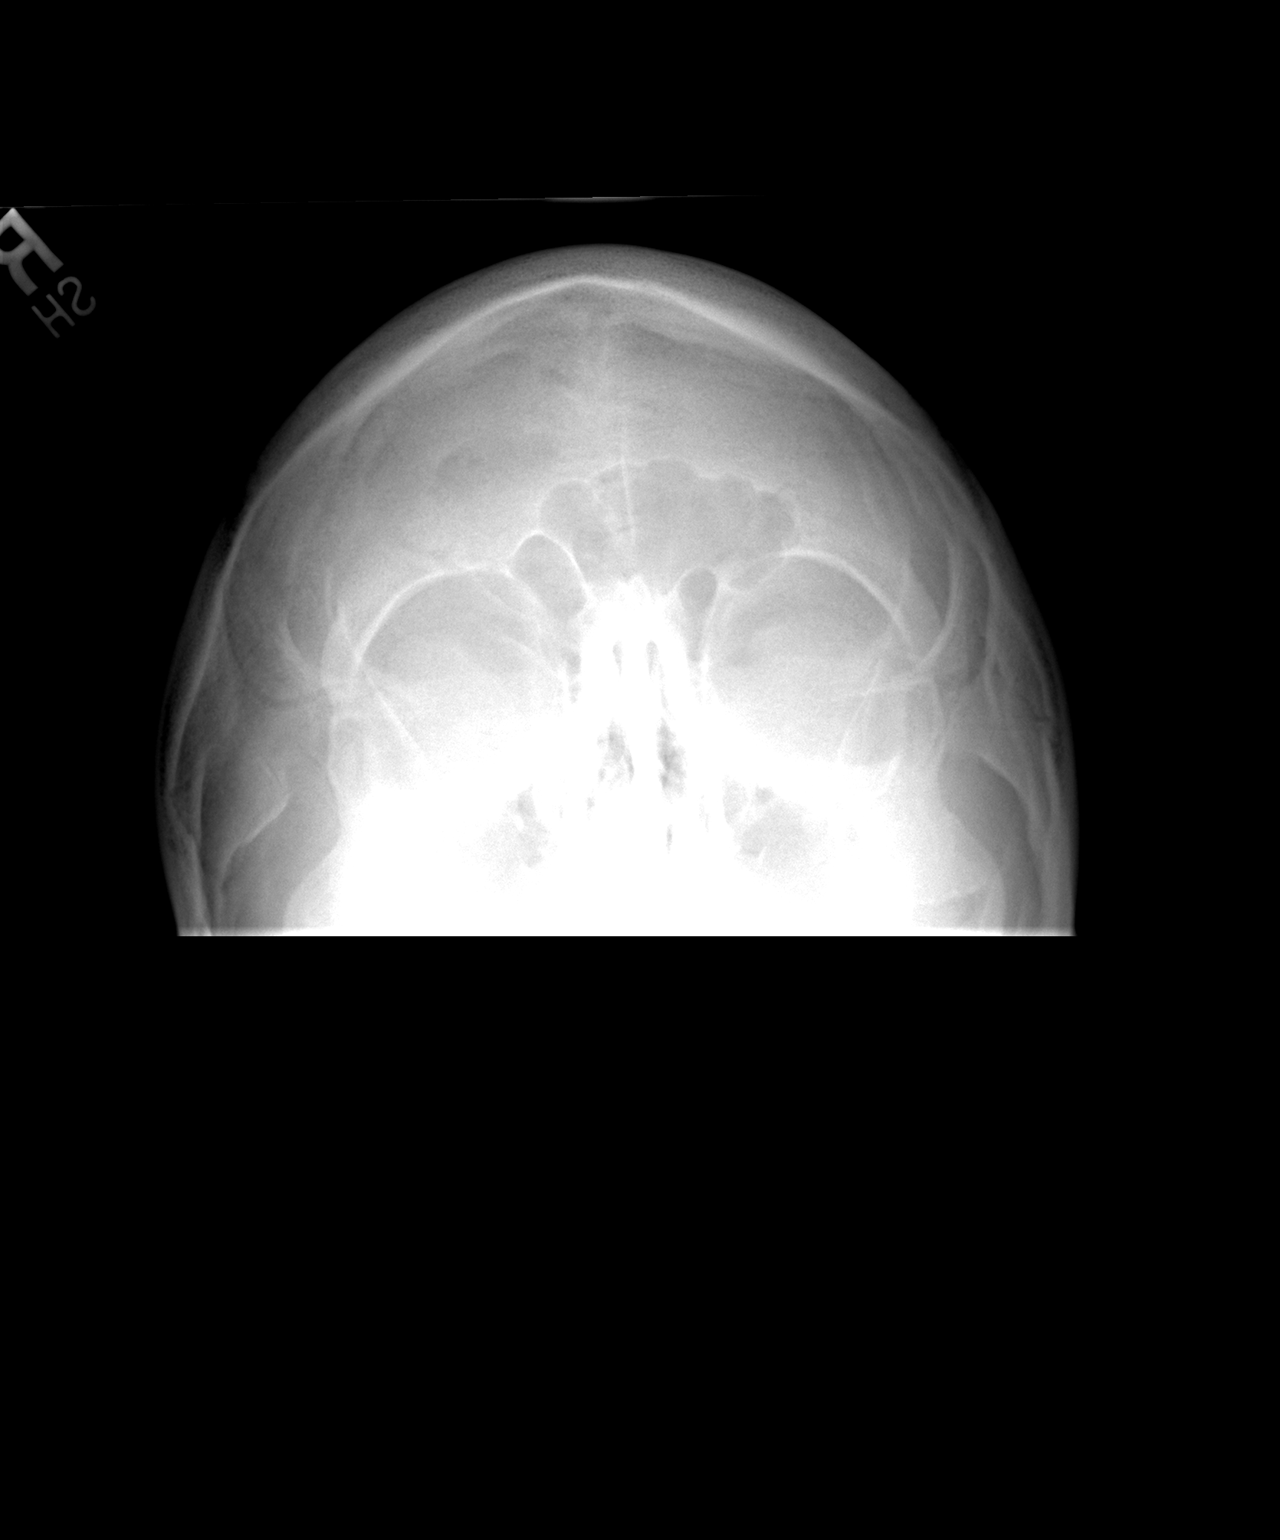

[view not recorded (2 of 2)]
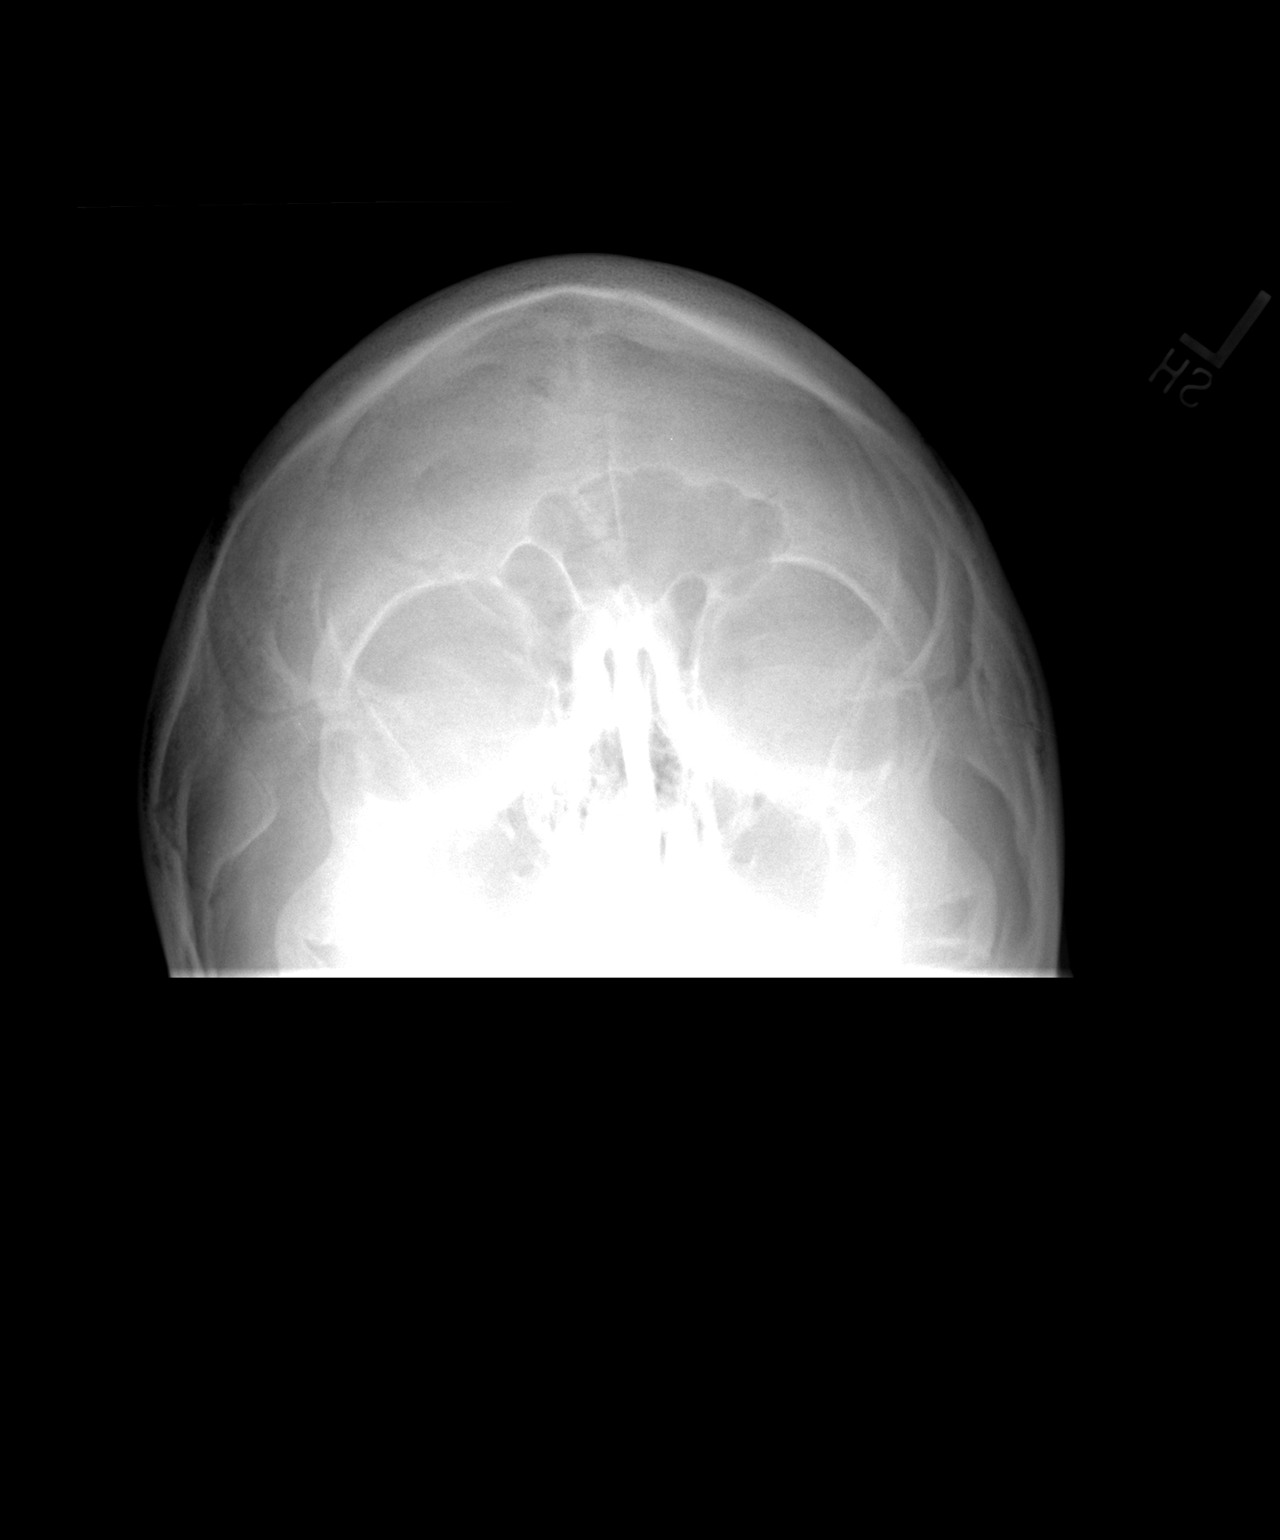

[2 of 2 positions shown; findings below may reference images not displayed]

FINDINGS: There is no evidence of metallic foreign body within the orbits. No
significant bone abnormality identified.
IMPRESSION: No evidence of metallic foreign body within the orbits.

## 2015-07-14 ENCOUNTER — Ambulatory Visit (INDEPENDENT_AMBULATORY_CARE_PROVIDER_SITE_OTHER): Payer: BLUE CROSS/BLUE SHIELD | Admitting: Osteopathic Medicine

## 2015-07-14 ENCOUNTER — Encounter: Payer: Self-pay | Admitting: Osteopathic Medicine

## 2015-07-14 VITALS — BP 137/86 | HR 102 | Temp 99.5°F | Wt 193.0 lb

## 2015-07-14 DIAGNOSIS — J069 Acute upper respiratory infection, unspecified: Secondary | ICD-10-CM

## 2015-07-14 DIAGNOSIS — B9789 Other viral agents as the cause of diseases classified elsewhere: Principal | ICD-10-CM

## 2015-07-14 MED ORDER — IPRATROPIUM BROMIDE 0.03 % NA SOLN: 2.0000 | Freq: Two times a day (BID) | NASAL | Status: DC

## 2015-07-14 MED ORDER — HYDROCODONE-HOMATROPINE 5-1.5 MG/5ML PO SYRP: 5.0000 mL | ORAL_SOLUTION | Freq: Four times a day (QID) | ORAL | Status: DC | PRN

## 2015-07-14 MED ORDER — METHYLPREDNISOLONE 4 MG PO TBPK: ORAL_TABLET | ORAL | Status: DC

## 2015-07-14 MED ORDER — AZITHROMYCIN 250 MG PO TABS: ORAL_TABLET | ORAL | Status: DC

## 2015-07-14 NOTE — Patient Instructions (Signed)
Fill prescription for azithromycin antibiotics if symptoms not better 7-10 days after illness started, or if you get significantly worse in the meantime. Over the counter medications - can take Mucinex-D or Nyquil in addition to your other prescription treatment. Let us know if you're feeling significantly worse - we may need to do a chest x-ray. Please note that post-viral cough can linger for a few weeks, but let us know if it persists longer than 3 - 4 weeks or becomes severe.

## 2015-07-14 NOTE — Progress Notes (Signed)
HPI: Benjamin White is a 35 y.o. male who presents to [facility] [place]  today for chief complaint of:  Chief Complaint  Patient presents with  . Cough    . Assoc signs/symptoms: see ROS, . Duration: cough started maybe 6 days ago . Modifying factors: has tried the following OTC/Rx medications: Mucinex and Sudafed, Nyquil  . Context:  (+) sick contacts at home, they were all better after about 3 days    Past medical, social and family history reviewed. Current medications and allergies reviewed.     Review of Systems: CONSTITUTIONAL: yes fever/chills HEAD/EYES/EARS/NOSE/THROAT: yes headache, no vision change or hearing change, mild sore throat CARDIAC: No chest pain/pressure/palpitations RESPIRATORY: yes dry cough, no shortness of breath GASTROINTESTINAL: no nausea, no vomiting, no abdominal pain, no diarrhea MUSCULOSKELETAL: no myalgia/arthralgia   Exam:  BP 137/86 mmHg  Pulse 102  Temp(Src) 99.5 F (37.5 C) (Oral)  Wt 193 lb (87.544 kg) Constitutional: VSS, see above. General Appearance: alert, well-developed, well-nourished, NAD Eyes: Normal lids and conjunctive, non-icteric sclera, PERRLA Ears, Nose, Mouth, Throat: Normal external inspection ears/nares/mouth/lips/gums, abnormal - extensive scarring bilaterally and tubes present bilaterally, no drainage TM, MMM;       posterior pharynx with erythema, without exudate Neck: No masses, trachea midline. normal lymph nodes Respiratory: Normal respiratory effort. No  wheeze/rhonchi/rales Cardiovascular: S1/S2 normal, no murmur/rub/gallop auscultated. RRR.   ASSESSMENT/PLAN: See pt instructions - supportive care, fill abx if no better 7-10 days, RTC/ER  Precautions reviewed, tobacco cessation discussed.   Viral URI with cough - Plan: HYDROcodone-homatropine (HYCODAN) 5-1.5 MG/5ML syrup, methylPREDNISolone (MEDROL DOSEPAK) 4 MG TBPK tablet, ipratropium (ATROVENT) 0.03 % nasal spray  Acute  upper respiratory infection - Plan: azithromycin (ZITHROMAX) 250 MG tablet    Return if symptoms worsen or fail to improve.

## 2017-07-17 ENCOUNTER — Ambulatory Visit (INDEPENDENT_AMBULATORY_CARE_PROVIDER_SITE_OTHER): Payer: BLUE CROSS/BLUE SHIELD | Admitting: Sports Medicine

## 2017-07-17 ENCOUNTER — Encounter: Payer: Self-pay | Admitting: Sports Medicine

## 2017-07-17 DIAGNOSIS — J0100 Acute maxillary sinusitis, unspecified: Secondary | ICD-10-CM | POA: Diagnosis not present

## 2017-07-17 DIAGNOSIS — Z0000 Encounter for general adult medical examination without abnormal findings: Secondary | ICD-10-CM | POA: Diagnosis not present

## 2017-07-17 DIAGNOSIS — F17200 Nicotine dependence, unspecified, uncomplicated: Secondary | ICD-10-CM | POA: Diagnosis not present

## 2017-07-17 DIAGNOSIS — F17210 Nicotine dependence, cigarettes, uncomplicated: Secondary | ICD-10-CM

## 2017-07-17 MED ORDER — VARENICLINE TARTRATE 1 MG PO TABS: 1.0000 mg | ORAL_TABLET | Freq: Two times a day (BID) | ORAL | 3 refills | Status: AC

## 2017-07-17 MED ORDER — VARENICLINE TARTRATE 0.5 MG X 11 & 1 MG X 42 PO MISC: ORAL | 0 refills | Status: AC

## 2017-07-17 MED ORDER — DOXYCYCLINE HYCLATE 100 MG PO TABS: 100.0000 mg | ORAL_TABLET | Freq: Two times a day (BID) | ORAL | 0 refills | Status: AC

## 2017-07-17 MED ORDER — FLUTICASONE PROPIONATE 50 MCG/ACT NA SUSP: NASAL | 3 refills | Status: AC

## 2017-07-17 NOTE — Assessment & Plan Note (Signed)
Checking routine labs.  Patient will return for a physical.

## 2017-07-17 NOTE — Assessment & Plan Note (Signed)
Allergic to penicillin, azithromycin has been ineffective in the past. Starting doxycycline.  Flonase.

## 2017-07-17 NOTE — Assessment & Plan Note (Signed)
Starting Chantix. 

## 2017-07-17 NOTE — Progress Notes (Signed)
Subjective:    CC: Re-establish care.   HPI:  This is a pleasant 37 year old male, I have not seen him in 4 years, for the past several days he said increasing pain and pressure behind his maxillary sinuses with radiation to both ears, nasal discharge, cough, constitutional symptoms.  Moderate, persistent.  He is allergic to penicillin and azithromycin has been ineffective in the past.  Smoker: Currently smokes about 1/2 pack/day, interested in quitting.  Preventive measures: Due for routine labs.  I reviewed the past medical history, family history, social history, surgical history, and allergies today and no changes were needed.  Please see the problem list section below in epic for further details.  Past Medical History: Past Medical History:  Diagnosis Date  . Allergic rhinitis    Past Surgical History: Past Surgical History:  Procedure Laterality Date  . ADENOIDECTOMY    . MYRINGOTOMY    . TONSILLECTOMY     Social History: Social History   Socioeconomic History  . Marital status: Married    Spouse name: Maralyn SagoSarah  . Number of children: 1  . Years of education: assoc.  . Highest education level: None  Social Needs  . Financial resource strain: None  . Food insecurity - worry: None  . Food insecurity - inability: None  . Transportation needs - medical: None  . Transportation needs - non-medical: None  Occupational History  . Occupation: Sports administratorMECHANIC    Employer: [employer]  Tobacco Use  . Smoking status: Current Every Day Smoker    Packs/day: 0.25  . Smokeless tobacco: Former NeurosurgeonUser    Quit date: 05/09/2011  Substance and Sexual Activity  . Alcohol use: Yes    Comment: up to 4 drinks daily  . Drug use: No  . Sexual activity: None  Other Topics Concern  . None  Social History Narrative  . None   Family History: Family History  Problem Relation Age of Onset  . Hypertension Father    Allergies: Allergies  Allergen Reactions  . Penicillins Hives and Rash    Medications: See med rec.  Review of Systems: No headache, visual changes, nausea, vomiting, diarrhea, constipation, dizziness, abdominal pain, skin rash, fevers, chills, night sweats, swollen lymph nodes, weight loss, chest pain, body aches, joint swelling, muscle aches, shortness of breath, mood changes, visual or auditory hallucinations.  Objective:    General: Well Developed, well nourished, and in no acute distress.  Neuro: Alert and oriented x3, extra-ocular muscles intact, sensation grossly intact.  HEENT: Normocephalic, atraumatic, pupils equal round reactive to light, neck supple, no masses, no lymphadenopathy, thyroid nonpalpable.  Oropharynx, nasopharynx, ear canals unremarkable with the exception of scarring on the left tympanic membrane and tympanostomy tube in place on the right.  Mild tenderness to palpation over the maxillary sinuses. Skin: Warm and dry, no rashes noted.  Cardiac: Regular rate and rhythm, no murmurs rubs or gallops.  Respiratory: Clear to auscultation bilaterally. Not using accessory muscles, speaking in full sentences.  Abdominal: Soft, nontender, nondistended, positive bowel sounds, no masses, no organomegaly.  Musculoskeletal: Shoulder, elbow, wrist, hip, knee, ankle stable, and with full range of motion.  Impression and Recommendations:    The patient was counselled, risk factors were discussed, anticipatory guidance given.  Annual physical exam Checking routine labs.  Patient will return for a physical.  Cigarette smoker motivated to quit Starting Chantix  Acute non-recurrent maxillary sinusitis Allergic to penicillin, azithromycin has been ineffective in the past. Starting doxycycline.  Flonase. ___________________________________________ Ihor Austinhomas J. Benjamin Stainhekkekandam,  M.D., ABFM., CAQSM. Primary Care and [employer] of [employer] [facility]

## 2018-09-09 ENCOUNTER — Other Ambulatory Visit: Payer: Self-pay

## 2018-09-09 ENCOUNTER — Emergency Department (INDEPENDENT_AMBULATORY_CARE_PROVIDER_SITE_OTHER): Payer: BLUE CROSS/BLUE SHIELD

## 2018-09-09 ENCOUNTER — Emergency Department (INDEPENDENT_AMBULATORY_CARE_PROVIDER_SITE_OTHER)
Admission: EM | Admit: 2018-09-09 | Discharge: 2018-09-09 | Disposition: A | Discharge status: Home / Self Care | Payer: BLUE CROSS/BLUE SHIELD | Source: Home / Self Care | Attending: Family Medicine | Admitting: Family Medicine

## 2018-09-09 DIAGNOSIS — R0789 Other chest pain: Secondary | ICD-10-CM

## 2018-09-09 NOTE — ED Provider Notes (Signed)
[facility]    CSN: [id number] Arrival date & time: 09/09/18  1251     History   Chief Complaint Chief Complaint  Patient presents with  . Chest Pain    HPI Benjamin White is a 38 y.o. male.   After awakening yesterday morning patient noticed vague pain in his left upper anterior chest, and it has recurred briefly several times.  About an hour ago while driving, he developed a vague tingling sensation in his left lateral arm radiating to his left 2nd and 3rd fingers, now resolved.  He denies cough or shortness of breath, and no nausea/vomiting.  He recalls no injury, but admits that he has been using a tiller in his garden during the past several days.   He smokes 1 pack per day. Family history of hypertension (father).  The history is provided by the patient.  Chest Pain  Pain location:  L chest Pain quality: aching   Pain radiates to:  Neck Pain severity:  Mild Onset quality:  Sudden Duration:  2 days Timing:  Sporadic Progression:  Unchanged Chronicity:  New Context: at rest   Context: not breathing, not eating, not lifting, not movement, not raising an arm, not stress and not trauma   Relieved by:  None tried Worsened by:  Certain positions Ineffective treatments:  None tried Associated symptoms: anxiety and numbness   Associated symptoms: no abdominal pain, no AICD problem, no anorexia, no back pain, no cough, no diaphoresis, no dizziness, no dysphagia, no fatigue, no fever, no headache, no heartburn, no lower extremity edema, no nausea, no near-syncope, no palpitations, no PND, no shortness of breath, no syncope, no vomiting and no weakness   Risk factors: smoking     Past Medical History:  Diagnosis Date  . Allergic rhinitis     Patient Active Problem List   Diagnosis Date Noted  . Acute non-recurrent maxillary sinusitis 07/17/2017  . Lip numbness 07/25/2012  . Cigarette smoker motivated to quit 07/25/2012  . Annual physical exam 07/25/2012     Past Surgical History:  Procedure Laterality Date  . ADENOIDECTOMY    . MYRINGOTOMY    . TONSILLECTOMY         Home Medications    Prior to Admission medications   Medication Sig Start Date End Date Taking? Authorizing Provider  fluticasone (FLONASE) 50 MCG/ACT nasal spray One spray in each nostril twice a day, use left hand for right nostril, and right hand for left nostril. 07/17/17   Monica Becton, MD  varenicline (CHANTIX STARTING MONTH PAK) 0.5 MG X 11 & 1 MG X 42 tablet Take one 0.5mg  tablet by mouth qd for 3 days, then increase to one 0.5mg  tablet bid for 3 days, then increase to one 1mg  tablet bid. 07/17/17   Monica Becton, MD  varenicline (CHANTIX) 1 MG tablet Take 1 tablet (1 mg total) by mouth 2 (two) times daily. 07/17/17   Monica Becton, MD    Family History Family History  Problem Relation Age of Onset  . Hypertension Father     Social History Social History   Tobacco Use  . Smoking status: Current Every Day Smoker    Packs/day: 1.00  . Smokeless tobacco: Former Neurosurgeon    Quit date: 05/09/2011  Substance Use Topics  . Alcohol use: Yes    Comment: up to 4 drinks daily  . Drug use: No     Allergies   Penicillins   Review of Systems Review  of Systems  Constitutional: Negative for diaphoresis, fatigue and fever.  HENT: Negative for trouble swallowing.   Respiratory: Negative for cough and shortness of breath.   Cardiovascular: Positive for chest pain. Negative for palpitations, syncope, PND and near-syncope.  Gastrointestinal: Negative for abdominal pain, anorexia, heartburn, nausea and vomiting.  Musculoskeletal: Negative for back pain.  Neurological: Positive for numbness. Negative for dizziness, weakness and headaches.  All other systems reviewed and are negative.    Physical Exam Triage Vital Signs ED Triage Vitals  Enc Vitals Group     BP 09/09/18 1321 137/87     Pulse Rate 09/09/18 1321 98     Resp 09/09/18 1321  20     Temp 09/09/18 1321 98.4 F (36.9 C)     Temp Source 09/09/18 1321 Oral     SpO2 09/09/18 1321 99 %     Weight 09/09/18 1322 220 lb (99.8 kg)     Height 09/09/18 1322 6\' 1"  (1.854 m)     Head Circumference --      Peak Flow --      Pain Score 09/09/18 1322 5     Pain Loc --      Pain Edu? --      Excl. in GC? --    No data found.  Updated Vital Signs BP 137/87 (BP Location: Right Arm)   Pulse 98   Temp 98.4 F (36.9 C) (Oral)   Resp 20   Ht 6\' 1"  (1.854 m)   Wt 99.8 kg   SpO2 99%   BMI 29.03 kg/m   Visual Acuity Right Eye Distance:   Left Eye Distance:   Bilateral Distance:    Right Eye Near:   Left Eye Near:    Bilateral Near:     Physical Exam Vitals signs and nursing note reviewed.  Constitutional:      General: He is not in acute distress.    Appearance: He is well-developed.  HENT:     Head: Normocephalic.     Right Ear: External ear normal.     Left Ear: External ear normal.     Nose: Nose normal.     Mouth/Throat:     Pharynx: Oropharynx is clear.  Eyes:     Extraocular Movements: Extraocular movements intact.     Conjunctiva/sclera: Conjunctivae normal.     Pupils: Pupils are equal, round, and reactive to light.  Neck:     Musculoskeletal: Normal range of motion. No neck rigidity.  Cardiovascular:     Rate and Rhythm: Normal rate and regular rhythm.     Heart sounds: Normal heart sounds.  Pulmonary:     Breath sounds: Normal breath sounds.  Chest:     Chest wall: No swelling, tenderness or crepitus.  Abdominal:     General: Bowel sounds are normal.     Palpations: Abdomen is soft.     Tenderness: There is no abdominal tenderness.  Musculoskeletal:        General: No tenderness.     Right lower leg: No edema.     Left lower leg: No edema.  Skin:    General: Skin is warm and dry.     Findings: No rash.  Neurological:     Mental Status: He is alert.      UC Treatments / Results  Labs (all labs ordered are listed, but only  abnormal results are displayed) Labs Reviewed - No data to display  EKG  Rate:  97 BPM PR:  142 msec QT:  352 msec QTcH:  447 msec QRSD:  90 msec QRS axis:  64 degrees Interpretation:  normal sinus rhythm; no acute changes.  Comparison with EKG done 06 Dec 2012 shows no significant change  Radiology Dg Chest 2 View  Result Date: 09/09/2018 CLINICAL DATA:  Left anterior chest pain for 2 days. EXAM: CHEST - 2 VIEW COMPARISON:  None. FINDINGS: The heart size and mediastinal contours are within normal limits. Both lungs are clear. Mild scoliosis of spine is noted. IMPRESSION: No active cardiopulmonary disease. Electronically Signed   By: Sherian Rein M.D.   On: 09/09/2018 14:31    Procedures Procedures (including critical care time)  Medications Ordered in UC Medications - No data to display  Initial Impression / Assessment and Plan / UC Course  I have reviewed the triage vital signs and the nursing notes.  Pertinent labs & imaging results that were available during my care of the patient were reviewed by me and considered in my medical decision making (see chart for details).    Negative EKG and chest x-ray reassuring. Followup with Dr. Rodney Langton if symptoms persist two weeks.   Final Clinical Impressions(s) / UC Diagnoses   Final diagnoses:  Chest wall pain     Discharge Instructions     Recommend Ibuprofen , 4 tabs every 8 hours with food.   If symptoms become significantly worse during the night or over the weekend, proceed to the local emergency room.     ED Prescriptions    None         Lattie Haw, MD 09/09/18 1451

## 2018-09-09 NOTE — ED Triage Notes (Signed)
Pt stated that CP started yesterday across the upper chest.  Happened several times.  Has had the same pain today also.  Today he was driving, and had numbness in his hand at the time of CP.

## 2018-09-09 NOTE — Discharge Instructions (Signed)
Recommend Ibuprofen 200mg , 4 tabs every 8 hours with food.   If symptoms become significantly worse during the night or over the weekend, proceed to the local emergency room.

## 2020-07-03 IMAGING — DX CHEST - 2 VIEW
2 series · 2 of 2 positions shown · non-contrast
Comparison: None.

CLINICAL DATA: Left anterior chest pain for 2 days.

EXAM:
CHEST - 2 VIEW

[chest pa]
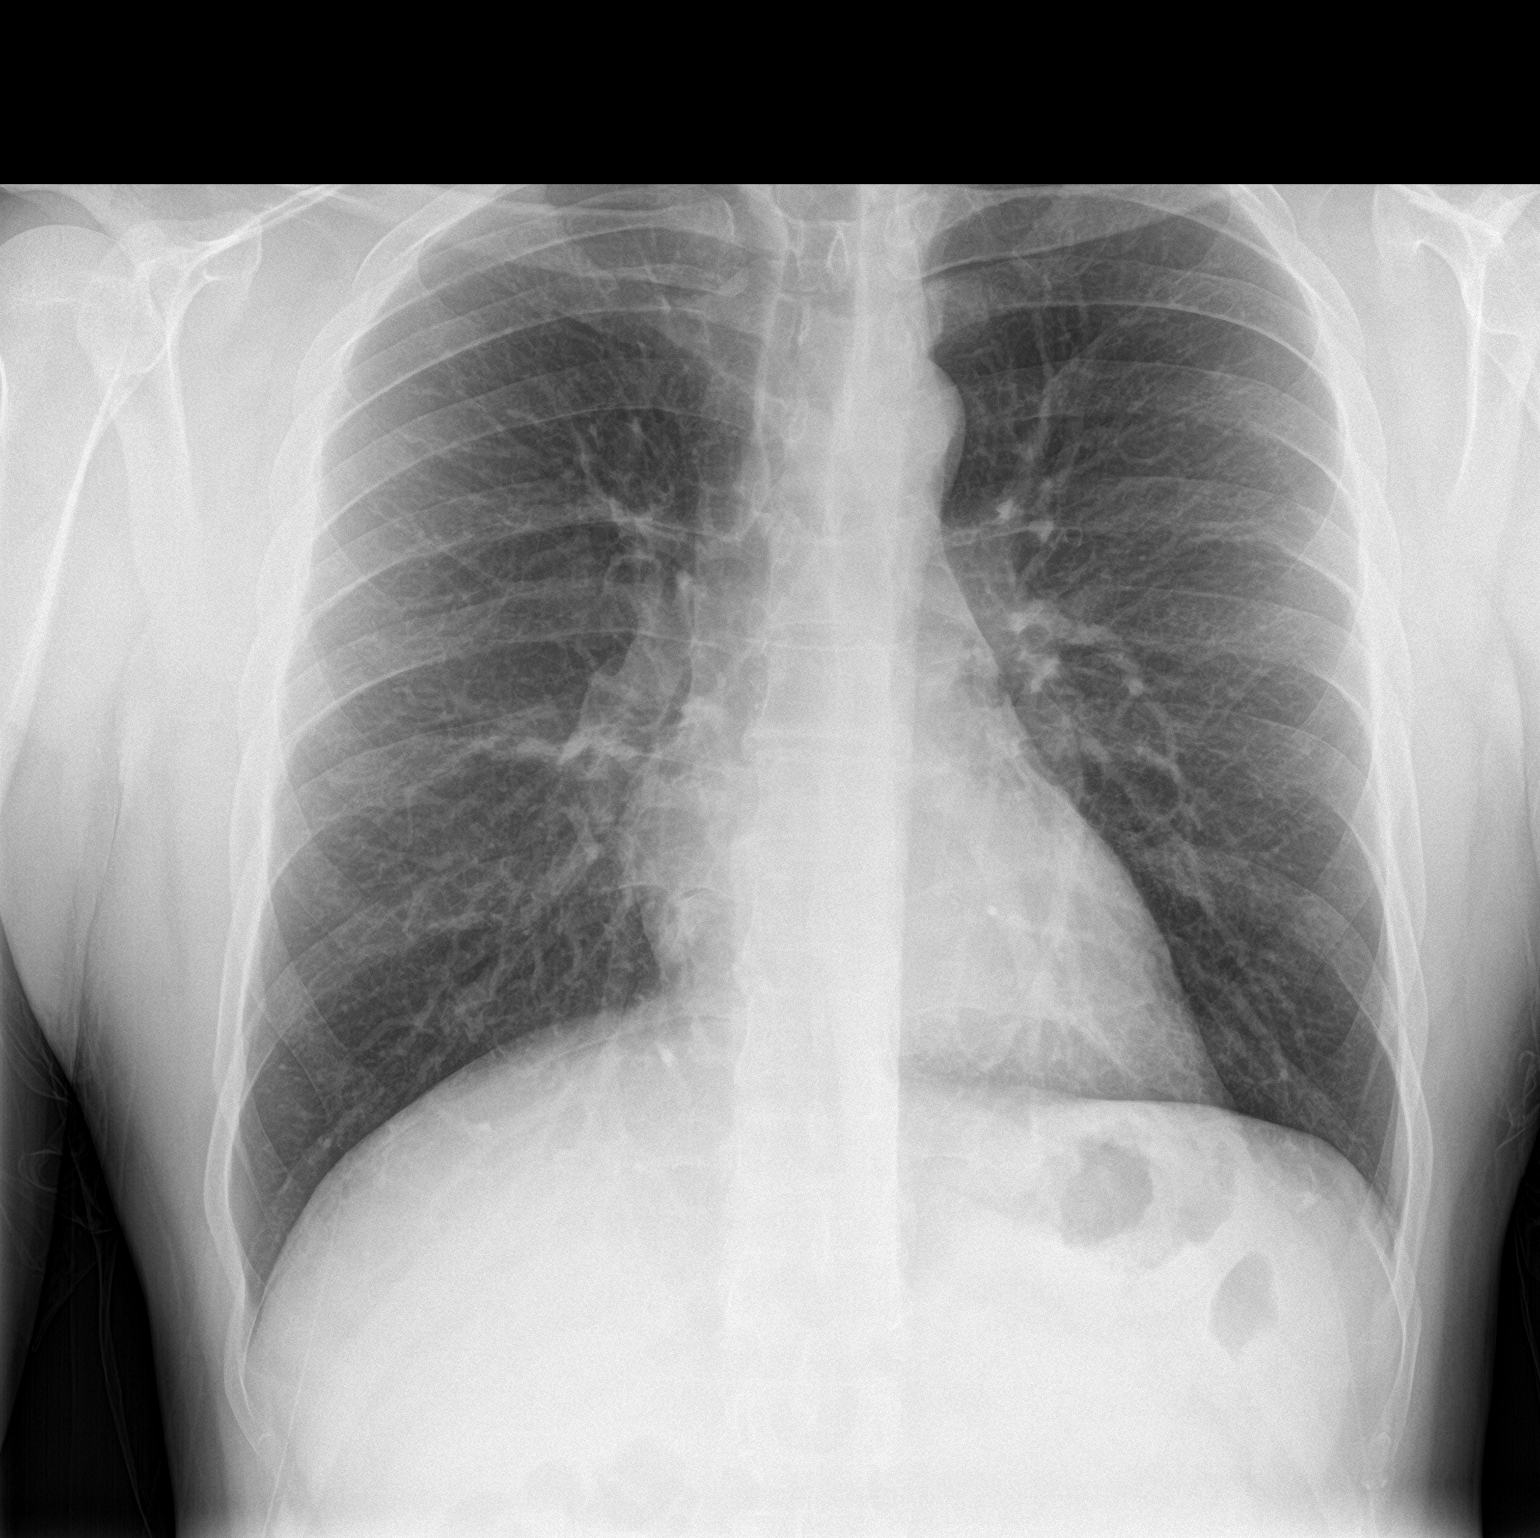

[chest lat]
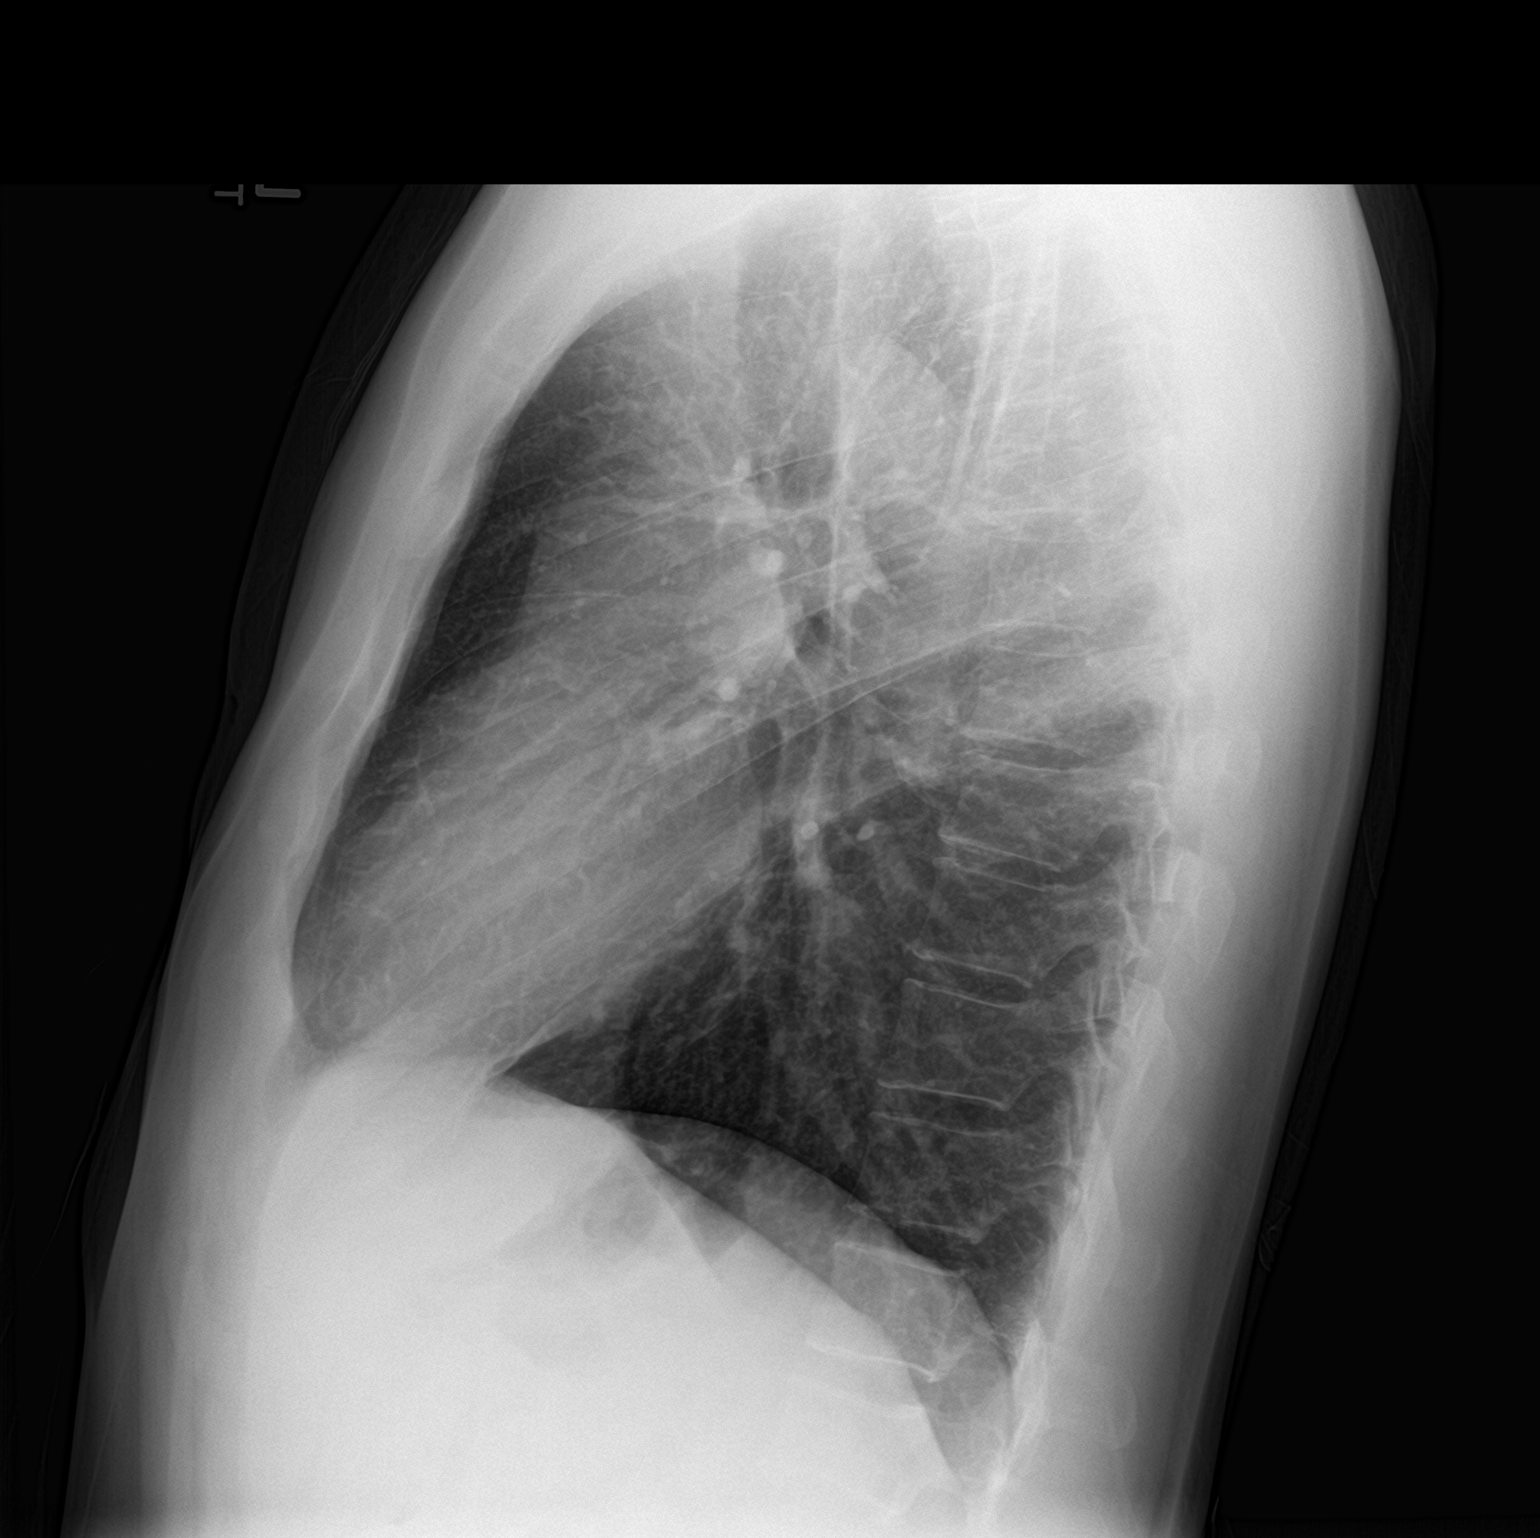

[2 of 2 positions shown; findings below may reference images not displayed]

FINDINGS: The heart size and mediastinal contours are within normal limits.
Both lungs are clear. Mild scoliosis of spine is noted.
IMPRESSION: No active cardiopulmonary disease.

## 2020-08-31 ENCOUNTER — Encounter: Payer: Self-pay | Admitting: Emergency Medicine

## 2020-08-31 ENCOUNTER — Emergency Department (INDEPENDENT_AMBULATORY_CARE_PROVIDER_SITE_OTHER)
Admission: EM | Admit: 2020-08-31 | Discharge: 2020-08-31 | Disposition: A | Discharge status: Home / Self Care | Payer: No Typology Code available for payment source | Source: Home / Self Care

## 2020-08-31 ENCOUNTER — Other Ambulatory Visit: Payer: Self-pay

## 2020-08-31 DIAGNOSIS — I10 Essential (primary) hypertension: Secondary | ICD-10-CM | POA: Diagnosis not present

## 2020-08-31 DIAGNOSIS — M25421 Effusion, right elbow: Secondary | ICD-10-CM

## 2020-08-31 DIAGNOSIS — F17210 Nicotine dependence, cigarettes, uncomplicated: Secondary | ICD-10-CM

## 2020-08-31 HISTORY — DX: Essential (primary) hypertension: I10

## 2020-08-31 MED ORDER — LISINOPRIL-HYDROCHLOROTHIAZIDE 10-12.5 MG PO TABS: 1.0000 | ORAL_TABLET | Freq: Every day | ORAL | 0 refills | Status: AC

## 2020-08-31 NOTE — ED Triage Notes (Signed)
Fluid to R ellbow since Friday  Can not recall specific injury  No OTC meds, no ice  Takes sudafed for allergies daily Has an appointment on Monday w/ new PCP on Monday for BP (169/111 in triage) COVID  vaccine

## 2020-08-31 NOTE — ED Provider Notes (Signed)
[facility]    CSN: [id number] Arrival date & time: 08/31/20  1715      History   Chief Complaint Chief Complaint  Patient presents with  . Joint Swelling    right    HPI Benjamin White is a 40 y.o. male.   Reports that he has extra fluid accumulating to his right elbow.  Reports that he has been playing baseball recently and throwing.  Reports that he thinks that this is causing the swelling to his right elbow.  Reports that he has had a fluid collection on his knees in the past, but not the elbows.  Has not taken anything for pain, states that its not very painful, just feels like it is in the way.  States that he takes Sudafed for allergies daily.  States that he has an appointment for a new PCP on Monday.  Reports that he knew his blood pressure was high, he did not realize it was as high as it was.  Denies ever taking any blood pressure medication in the past.  States that he smokes a pack and a half of cigarettes per day.  Denies headache, changes in vision, changes in color or amount of urine, extremity edema, chest pain, shortness of breath, other symptoms.  ROS per HPI  The history is provided by the patient.    Past Medical History:  Diagnosis Date  . Allergic rhinitis   . Hypertension     Patient Active Problem List   Diagnosis Date Noted  . Acute non-recurrent maxillary sinusitis 07/17/2017  . Lip numbness 07/25/2012  . Cigarette smoker motivated to quit 07/25/2012  . Annual physical exam 07/25/2012    Past Surgical History:  Procedure Laterality Date  . ADENOIDECTOMY    . MYRINGOTOMY    . TONSILLECTOMY         Home Medications    Prior to Admission medications   Medication Sig Start Date End Date Taking? Authorizing Provider  fluticasone (FLONASE) 50 MCG/ACT nasal spray One spray in each nostril twice a day, use left hand for right nostril, and right hand for left nostril. 07/17/17  Yes Monica Becton, MD  levocetirizine  (XYZAL) 5 MG tablet Take 5 mg by mouth every evening.   Yes [provider]  lisinopril-hydrochlorothiazide (ZESTORETIC) 10-12.5 MG tablet Take 1 tablet by mouth daily. 08/31/20 09/30/20 Yes Moshe Cipro, NP  pseudoephedrine (SUDAFED) 30 MG tablet Take 30 mg by mouth every 4 (four) hours as needed for congestion.   Yes [provider]  varenicline (CHANTIX STARTING MONTH PAK) 0.5 MG X 11 & 1 MG X 42 tablet Take one 0.5mg  tablet by mouth qd for 3 days, then increase to one 0.5mg  tablet bid for 3 days, then increase to one 1mg  tablet bid. Patient not taking: Reported on 08/31/2020 07/17/17   09/16/17, MD  varenicline (CHANTIX) 1 MG tablet Take 1 tablet (1 mg total) by mouth 2 (two) times daily. Patient not taking: Reported on 08/31/2020 07/17/17   09/16/17, MD    Family History Family History  Problem Relation Age of Onset  . Hypertension Father   . Healthy Mother   . Healthy Sister   . Healthy Sister     Social History Social History   Tobacco Use  . Smoking status: Current Every Day Smoker    Packs/day: 1.25    Years: 20.00    Pack years: 25.00  . Smokeless tobacco: Former Monica Becton  Quit date: 05/09/2011  Vaping Use  . Vaping Use: Never used  Substance Use Topics  . Alcohol use: Yes    Alcohol/week: 14.0 standard drinks    Types: 14 Standard drinks or equivalent per week  . Drug use: No     Allergies   Penicillins   Review of Systems Review of Systems   Physical Exam Triage Vital Signs ED Triage Vitals  Enc Vitals Group     BP 08/31/20 1730 (!) 169/111     Pulse Rate 08/31/20 1730 94     Resp 08/31/20 1730 16     Temp 08/31/20 1730 98.9 F (37.2 C)     Temp Source 08/31/20 1730 Oral     SpO2 08/31/20 1730 100 %     Weight --      Height --      Head Circumference --      Peak Flow --      Pain Score 08/31/20 1733 0     Pain Loc --      Pain Edu? --      Excl. in GC? --    No data found.  Updated Vital  Signs BP (!) 169/111 (BP Location: Left Arm)   Pulse 94   Temp 98.9 F (37.2 C) (Oral)   Resp 16   SpO2 100%      Physical Exam Vitals and nursing note reviewed.  Constitutional:      General: He is not in acute distress.    Appearance: Normal appearance. He is well-developed and normal weight. He is not ill-appearing.  HENT:     Head: Normocephalic and atraumatic.     Nose: Nose normal.     Mouth/Throat:     Mouth: Mucous membranes are moist.     Pharynx: Oropharynx is clear.  Eyes:     Extraocular Movements: Extraocular movements intact.     Conjunctiva/sclera: Conjunctivae normal.     Pupils: Pupils are equal, round, and reactive to light.  Cardiovascular:     Rate and Rhythm: Normal rate and regular rhythm.     Heart sounds: Normal heart sounds. No murmur heard.   Pulmonary:     Effort: Pulmonary effort is normal. No respiratory distress.     Breath sounds: Normal breath sounds. No stridor. No wheezing, rhonchi or rales.  Chest:     Chest wall: No tenderness.  Abdominal:     Palpations: Abdomen is soft.     Tenderness: There is no abdominal tenderness.  Musculoskeletal:        General: Swelling (Obvious fluid pocket to the right elbow) present.     Cervical back: Normal range of motion and neck supple.  Skin:    General: Skin is warm and dry.     Capillary Refill: Capillary refill takes less than 2 seconds.  Neurological:     General: No focal deficit present.     Mental Status: He is alert and oriented to person, place, and time.     Cranial Nerves: No cranial nerve deficit.     Sensory: No sensory deficit.     Motor: No weakness.     Coordination: Coordination normal.     Gait: Gait normal.     Deep Tendon Reflexes: Reflexes normal.  Psychiatric:        Mood and Affect: Mood normal.        Behavior: Behavior normal.        Thought Content: Thought content normal.  UC Treatments / Results  Labs (all labs ordered are listed, but only abnormal  results are displayed) Labs Reviewed - No data to display  EKG   Radiology No results found.  Procedures Join Aspiration/Injection  Date/Time: 08/31/2020 6:16 PM Performed by: Moshe Cipro, NP Authorized by: Moshe Cipro, NP   Consent:    Consent obtained:  Verbal   Consent given by:  Patient   Risks, benefits, and alternatives were discussed: yes     Risks discussed:  Bleeding and infection   Alternatives discussed:  Alternative treatment Universal protocol:    Patient identity confirmed:  Verbally with patient and arm band Location:    Location:  Elbow   Elbow:  R elbow Anesthesia:    Anesthesia method:  None Procedure details:    Needle gauge:  22 G   Ultrasound guidance: no     Approach:  Lateral   Aspirate amount:  57mL   Aspirate characteristics:  Blood-tinged, clear and yellow   Steroid injected: no     Specimen collected: no   Post-procedure details:    Dressing:  Adhesive bandage   Procedure completion:  Tolerated well, no immediate complications   (including critical care time)  Medications Ordered in UC Medications - No data to display  Initial Impression / Assessment and Plan / UC Course  I have reviewed the triage vital signs and the nursing notes.  Pertinent labs & imaging results that were available during my care of the patient were reviewed by me and considered in my medical decision making (see chart for details).    Right elbow effusion Essential hypertension Tobacco dependence  Aspirated 11 mL of fluid from the right elbow effusion I have sent in Zestoretic 10-12.5 mg for you to take once daily Discussed that his blood pressure was dangerously high today First blood pressure was 180/120s Discussed how smoking can affect this and increase blood pressure Follow-up with primary care as scheduled Follow-up with the ER for headache, vision changes, changes or loss of strength or sensation in extremities, heaviness in the limbs,  trouble swallowing, trouble breathing, chest pain, other concerning symptoms  Final Clinical Impressions(s) / UC Diagnoses   Final diagnoses:  Effusion of bursa of right elbow  Hypertension, unspecified type  Tobacco dependence due to cigarettes     Discharge Instructions     On gentle fluid from your elbow today  I have sent in lisinopril-hydrochlorothiazide 10-12.5mg  for you to take once daily  Monitor your blood pressure in the mornings and write down the numbers to take to your PCP.  On the blood pressure log, Mark whether you took the blood pressure medication that day or not.  I would encourage you to take the blood pressure medication in the mornings, there is a small amount of a diuretic in it, and this may increase the amount of times he feel acute to urinate during the day.  Follow-up with PCP as scheduled  Follow-up with the ER for headache, changes in vision, changes in sensation or loss of strength in the extremities, heaviness in the limbs, chest pain, shortness of breath, other concerning symptoms    ED Prescriptions    Medication Sig Dispense Auth. Provider   lisinopril-hydrochlorothiazide (ZESTORETIC) 10-12.5 MG tablet Take 1 tablet by mouth daily. 30 tablet Moshe Cipro, NP     PDMP not reviewed this encounter.   Moshe Cipro, NP 08/31/20 1817

## 2020-08-31 NOTE — Discharge Instructions (Signed)
On gentle fluid from your elbow today  I have sent in lisinopril-hydrochlorothiazide 10-12.5mg  for you to take once daily  Monitor your blood pressure in the mornings and write down the numbers to take to your PCP.  On the blood pressure log, Mark whether you took the blood pressure medication that day or not.  I would encourage you to take the blood pressure medication in the mornings, there is a small amount of a diuretic in it, and this may increase the amount of times he feel acute to urinate during the day.  Follow-up with PCP as scheduled  Follow-up with the ER for headache, changes in vision, changes in sensation or loss of strength in the extremities, heaviness in the limbs, chest pain, shortness of breath, other concerning symptoms

## 2022-04-22 ENCOUNTER — Ambulatory Visit
Admission: EM | Admit: 2022-04-22 | Discharge: 2022-04-22 | Disposition: A | Discharge status: Home / Self Care | Payer: BC Managed Care – PPO

## 2022-04-22 ENCOUNTER — Other Ambulatory Visit: Payer: Self-pay

## 2022-04-22 DIAGNOSIS — J309 Allergic rhinitis, unspecified: Secondary | ICD-10-CM

## 2022-04-22 DIAGNOSIS — J0100 Acute maxillary sinusitis, unspecified: Secondary | ICD-10-CM

## 2022-04-22 DIAGNOSIS — R059 Cough, unspecified: Secondary | ICD-10-CM

## 2022-04-22 MED ORDER — BENZONATATE 200 MG PO CAPS: 200.0000 mg | ORAL_CAPSULE | Freq: Three times a day (TID) | ORAL | 0 refills | Status: AC | PRN

## 2022-04-22 MED ORDER — PREDNISONE 20 MG PO TABS: ORAL_TABLET | ORAL | 0 refills | Status: AC

## 2022-04-22 MED ORDER — FEXOFENADINE HCL 180 MG PO TABS: 180.0000 mg | ORAL_TABLET | Freq: Every day | ORAL | 0 refills | Status: AC

## 2022-04-22 MED ORDER — PROMETHAZINE-DM 6.25-15 MG/5ML PO SYRP: 5.0000 mL | ORAL_SOLUTION | Freq: Two times a day (BID) | ORAL | 0 refills | Status: AC | PRN

## 2022-04-22 MED ORDER — DOXYCYCLINE HYCLATE 100 MG PO CAPS: 100.0000 mg | ORAL_CAPSULE | Freq: Two times a day (BID) | ORAL | 0 refills | Status: AC

## 2022-04-22 NOTE — Discharge Instructions (Addendum)
Advised patient to hold Xyzal and Flonase while taking these medications.  Instructed patient to take medication as directed with food to completion.  Advised patient to take prednisone along with first dose of doxycycline for the next 5 of 7 days.  Advised may use Allegra as needed afterwards for concurrent postnasal drainage/drip.  Advised may use Tessalon daily or as needed for cough.  Advised may use Promethazine DM at night for cough due to sedative effects.  Advised do not use cough medications together.  Advised patient to increase daily water intake to 64 ounces per day while taking these medications.  Advised patient if symptoms worsen and/or unresolved please follow-up with PCP or here for further evaluation.

## 2022-04-22 NOTE — ED Triage Notes (Signed)
Pt c/o cough and congestion since last Friday. RT ear pain, some drainage since Wed. Denies fever. Afrin and mucinex prn.

## 2022-04-22 NOTE — ED Provider Notes (Signed)
[facility]    CSN: [id number] Arrival date & time: 04/22/22  0915      History   Chief Complaint Chief Complaint  Patient presents with   Cough   Nasal Congestion   Otalgia    RT    HPI Benjamin White is a 41 y.o. male.   HPI 41 year old male presents with cough, congestion and right ear pain for 8 days.  PMH significant for HTN and nicotine dependence.  Past Medical History:  Diagnosis Date   Allergic rhinitis    Hypertension     Patient Active Problem List   Diagnosis Date Noted   Acute non-recurrent maxillary sinusitis 07/17/2017   Lip numbness 07/25/2012   Cigarette smoker motivated to quit 07/25/2012   Annual physical exam 07/25/2012    Past Surgical History:  Procedure Laterality Date   ADENOIDECTOMY     MYRINGOTOMY     TONSILLECTOMY         Home Medications    Prior to Admission medications   Medication Sig Start Date End Date Taking? Authorizing Provider  tiZANidine (ZANAFLEX) 4 MG tablet Take 1 tablet by mouth at bedtime as needed. 12/26/21  Yes [provider]  benzonatate (TESSALON) 200 MG capsule Take 1 capsule (200 mg total) by mouth 3 (three) times daily as needed for up to 7 days. 04/22/22 04/29/22 Yes Trevor Iha, FNP  doxycycline (VIBRAMYCIN) 100 MG capsule Take 1 capsule (100 mg total) by mouth 2 (two) times daily for 7 days. 04/22/22 04/29/22 Yes Trevor Iha, FNP  fexofenadine [facility] ALLERGY) 180 MG tablet Take 1 tablet (180 mg total) by mouth daily for 15 days. 04/22/22 05/07/22 Yes Trevor Iha, FNP  fluticasone (FLONASE) 50 MCG/ACT nasal spray One spray in each nostril twice a day, use left hand for right nostril, and right hand for left nostril. 07/17/17   Monica Becton, MD  levocetirizine (XYZAL) 5 MG tablet Take 5 mg by mouth every evening.    [provider]  lisinopril-hydrochlorothiazide (ZESTORETIC) 10-12.5 MG tablet Take 1 tablet by mouth daily. 08/31/20 09/30/20  Moshe Cipro, NP  predniSONE (DELTASONE) 20 MG tablet Take 3 tabs PO daily x 5 days. 04/22/22  Yes Trevor Iha, FNP  promethazine-dextromethorphan (PROMETHAZINE-DM) 6.25-15 MG/5ML syrup Take 5 mLs by mouth 2 (two) times daily as needed for cough. 04/22/22  Yes Trevor Iha, FNP  pseudoephedrine (SUDAFED) 30 MG tablet Take 30 mg by mouth every 4 (four) hours as needed for congestion.    [provider]  varenicline (CHANTIX STARTING MONTH PAK) 0.5 MG X 11 & 1 MG X 42 tablet Take one 0.5mg  tablet by mouth qd for 3 days, then increase to one 0.5mg  tablet bid for 3 days, then increase to one 1mg  tablet bid. Patient not taking: Reported on 08/31/2020 07/17/17   09/16/17, MD  varenicline (CHANTIX) 1 MG tablet Take 1 tablet (1 mg total) by mouth 2 (two) times daily. Patient not taking: Reported on 08/31/2020 07/17/17   09/16/17, MD    Family History Family History  Problem Relation Age of Onset   Hypertension Father    Healthy Mother    Healthy Sister    Healthy Sister     Social History Social History   Tobacco Use   Smoking status: Every Day    Packs/day: 1.25    Years: 20.00    Total pack years: 25.00    Types: Cigarettes   Smokeless tobacco: Former    Quit  date: 05/09/2011  Vaping Use   Vaping Use: Never used  Substance Use Topics   Alcohol use: Yes    Alcohol/week: 14.0 standard drinks of alcohol    Types: 14 Standard drinks or equivalent per week   Drug use: No     Allergies   Penicillins   Review of Systems Review of Systems  HENT:  Positive for congestion.   Respiratory:  Positive for cough.   All other systems reviewed and are negative.    Physical Exam Triage Vital Signs ED Triage Vitals  Enc Vitals Group     BP 04/22/22 0937 (!) 138/105     Pulse Rate 04/22/22 0937 87     Resp 04/22/22 0937 17     Temp 04/22/22 0937 98.2 F (36.8 C)     Temp Source 04/22/22 0937 Oral     SpO2 04/22/22 0937 99 %     Weight 04/22/22  0939 200 lb (90.7 kg)     Height --      Head Circumference --      Peak Flow --      Pain Score 04/22/22 0939 4     Pain Loc --      Pain Edu? --      Excl. in GC? --    No data found.  Updated Vital Signs BP (!) 138/105 (BP Location: Right Arm)   Pulse 87   Temp 98.2 F (36.8 C) (Oral)   Resp 17   Wt 200 lb (90.7 kg)   SpO2 99%   BMI 26.39 kg/m      Physical Exam Vitals and nursing note reviewed.  Constitutional:      Appearance: Normal appearance. He is normal weight.  HENT:     Head: Normocephalic and atraumatic.     Right Ear: External ear normal.     Left Ear: External ear normal.     Ears:     Comments: Significant eustachian tube dysfunction noted bilaterally; TM's reveal mucopurulent discharge with intact/stable aqua blue TP tubes noted    Mouth/Throat:     Mouth: Mucous membranes are moist.     Pharynx: Oropharynx is clear.     Comments: Significant amount amount of clear drainage of posterior oropharynx noted Eyes:     Extraocular Movements: Extraocular movements intact.     Conjunctiva/sclera: Conjunctivae normal.     Pupils: Pupils are equal, round, and reactive to light.  Cardiovascular:     Rate and Rhythm: Normal rate and regular rhythm.     Pulses: Normal pulses.     Heart sounds: Normal heart sounds.  Pulmonary:     Effort: Pulmonary effort is normal.     Breath sounds: Normal breath sounds. No wheezing, rhonchi or rales.     Comments: Frequent nonproductive cough noted on exam Musculoskeletal:        General: Normal range of motion.     Cervical back: Normal range of motion and neck supple.  Skin:    General: Skin is warm and dry.  Neurological:     General: No focal deficit present.     Mental Status: He is alert and oriented to person, place, and time. Mental status is at baseline.      UC Treatments / Results  Labs (all labs ordered are listed, but only abnormal results are displayed) Labs Reviewed - No data to  display  EKG   Radiology No results found.  Procedures Procedures (including critical care time)  Medications Ordered  in UC Medications - No data to display  Initial Impression / Assessment and Plan / UC Course  I have reviewed the triage vital signs and the nursing notes.  Pertinent labs & imaging results that were available during my care of the patient were reviewed by me and considered in my medical decision making (see chart for details).     MDM: 1.  Acute maxillary sinusitis, recurrence not specified-doxycycline; 2.  Cough-Rx'd prednisone, Tessalon, Promethazine DM; 3.  Allergic rhinitis-Rx'd Allegra. Advised patient to hold Xyzal and Flonase while taking these medications.  Instructed patient to take medication as directed with food to completion.  Advised patient to take prednisone along with first dose of doxycycline for the next 5 of 7 days.  Advised may use Allegra as needed afterwards for concurrent postnasal drainage/drip.  Advised may use Tessalon daily or as needed for cough.  Advised may use Promethazine DM at night for cough due to sedative effects.  Advised do not use cough medications together.  Advised patient to increase daily water intake to 64 ounces per day while taking these medications.  Advised patient if symptoms worsen and/or unresolved please follow-up with PCP or here for further evaluation.  Patient discharged home, hemodynamically stable. Final Clinical Impressions(s) / UC Diagnoses   Final diagnoses:  Acute maxillary sinusitis, recurrence not specified  Cough, unspecified type  Allergic rhinitis, unspecified seasonality, unspecified trigger     Discharge Instructions      Advised patient to hold Xyzal and Flonase while taking these medications.  Instructed patient to take medication as directed with food to completion.  Advised patient to take prednisone along with first dose of doxycycline for the next 5 of 7 days.  Advised may use Allegra as  needed afterwards for concurrent postnasal drainage/drip.  Advised may use Tessalon daily or as needed for cough.  Advised may use Promethazine DM at night for cough due to sedative effects.  Advised do not use cough medications together.  Advised patient to increase daily water intake to 64 ounces per day while taking these medications.  Advised patient if symptoms worsen and/or unresolved please follow-up with PCP or here for further evaluation.     ED Prescriptions     Medication Sig Dispense Auth. Provider   doxycycline (VIBRAMYCIN) 100 MG capsule Take 1 capsule (100 mg total) by mouth 2 (two) times daily for 7 days. 14 capsule Trevor Iha, FNP   predniSONE (DELTASONE) 20 MG tablet Take 3 tabs PO daily x 5 days. 15 tablet Trevor Iha, FNP   fexofenadine [facility] ALLERGY) 180 MG tablet Take 1 tablet (180 mg total) by mouth daily for 15 days. 15 tablet Trevor Iha, FNP   benzonatate (TESSALON) 200 MG capsule Take 1 capsule (200 mg total) by mouth 3 (three) times daily as needed for up to 7 days. 40 capsule Trevor Iha, FNP   promethazine-dextromethorphan (PROMETHAZINE-DM) 6.25-15 MG/5ML syrup Take 5 mLs by mouth 2 (two) times daily as needed for cough. 118 mL Trevor Iha, FNP      PDMP not reviewed this encounter.   Trevor Iha, FNP 04/22/22 1216
